# Patient Record
Sex: Male | Born: 1940 | Race: White | Hispanic: No | Smoking: Former smoker
Health system: Southern US, Community
[De-identification: ages and names within clinical notes are randomized; demographics above are authoritative.]

## PROBLEM LIST (undated history)

## (undated) DIAGNOSIS — I82409 Acute embolism and thrombosis of unspecified deep veins of unspecified lower extremity: Secondary | ICD-10-CM

## (undated) DIAGNOSIS — O2230 Deep phlebothrombosis in pregnancy, unspecified trimester: Secondary | ICD-10-CM

## (undated) DIAGNOSIS — M1990 Unspecified osteoarthritis, unspecified site: Secondary | ICD-10-CM

## (undated) DIAGNOSIS — D689 Coagulation defect, unspecified: Secondary | ICD-10-CM

## (undated) DIAGNOSIS — I4510 Unspecified right bundle-branch block: Secondary | ICD-10-CM

## (undated) DIAGNOSIS — D509 Iron deficiency anemia, unspecified: Secondary | ICD-10-CM

## (undated) DIAGNOSIS — I2699 Other pulmonary embolism without acute cor pulmonale: Secondary | ICD-10-CM

## (undated) DIAGNOSIS — H9190 Unspecified hearing loss, unspecified ear: Secondary | ICD-10-CM

## (undated) DIAGNOSIS — R6510 Systemic inflammatory response syndrome (SIRS) of non-infectious origin without acute organ dysfunction: Secondary | ICD-10-CM

## (undated) DIAGNOSIS — T7840XA Allergy, unspecified, initial encounter: Secondary | ICD-10-CM

## (undated) DIAGNOSIS — C439 Malignant melanoma of skin, unspecified: Secondary | ICD-10-CM

## (undated) DIAGNOSIS — I10 Essential (primary) hypertension: Secondary | ICD-10-CM

## (undated) DIAGNOSIS — H269 Unspecified cataract: Secondary | ICD-10-CM

## (undated) DIAGNOSIS — C801 Malignant (primary) neoplasm, unspecified: Secondary | ICD-10-CM

## (undated) DIAGNOSIS — D649 Anemia, unspecified: Secondary | ICD-10-CM

## (undated) DIAGNOSIS — C4491 Basal cell carcinoma of skin, unspecified: Secondary | ICD-10-CM

## (undated) DIAGNOSIS — E669 Obesity, unspecified: Secondary | ICD-10-CM

## (undated) DIAGNOSIS — K219 Gastro-esophageal reflux disease without esophagitis: Secondary | ICD-10-CM

## (undated) DIAGNOSIS — R0902 Hypoxemia: Secondary | ICD-10-CM

## (undated) HISTORY — DX: Systemic inflammatory response syndrome (sirs) of non-infectious origin without acute organ dysfunction: R65.10

## (undated) HISTORY — DX: Coagulation defect, unspecified: D68.9

## (undated) HISTORY — DX: Malignant melanoma of skin, unspecified: C43.9

## (undated) HISTORY — DX: Obesity, unspecified: E66.9

## (undated) HISTORY — DX: Gastro-esophageal reflux disease without esophagitis: K21.9

## (undated) HISTORY — DX: Acute embolism and thrombosis of unspecified deep veins of unspecified lower extremity: I82.409

## (undated) HISTORY — DX: Unspecified hearing loss, unspecified ear: H91.90

## (undated) HISTORY — DX: Unspecified right bundle-branch block: I45.10

## (undated) HISTORY — DX: Hypoxemia: R09.02

## (undated) HISTORY — PX: WISDOM TOOTH EXTRACTION: SHX21

## (undated) HISTORY — DX: Basal cell carcinoma of skin, unspecified: C44.91

## (undated) HISTORY — DX: Anemia, unspecified: D64.9

## (undated) HISTORY — DX: Allergy, unspecified, initial encounter: T78.40XA

## (undated) HISTORY — DX: Malignant (primary) neoplasm, unspecified: C80.1

## (undated) HISTORY — DX: Deep phlebothrombosis in pregnancy, unspecified trimester: O22.30

## (undated) HISTORY — DX: Unspecified osteoarthritis, unspecified site: M19.90

## (undated) HISTORY — DX: Unspecified cataract: H26.9

## (undated) HISTORY — DX: Other pulmonary embolism without acute cor pulmonale: I26.99

## (undated) HISTORY — PX: COLONOSCOPY: SHX174

## (undated) HISTORY — PX: ESOPHAGOGASTRODUODENOSCOPY: SHX1529

## (undated) HISTORY — PX: APPENDECTOMY: SHX54

## (undated) HISTORY — PX: TONSILLECTOMY: SUR1361

## (undated) HISTORY — DX: Iron deficiency anemia, unspecified: D50.9

## (undated) HISTORY — PX: CATARACT EXTRACTION: SUR2

---

## 1997-08-16 ENCOUNTER — Emergency Department (HOSPITAL_COMMUNITY): Admission: EM | Admit: 1997-08-16 | Discharge: 1997-08-16 | Payer: Self-pay | Admitting: Emergency Medicine

## 1997-08-19 ENCOUNTER — Emergency Department (HOSPITAL_COMMUNITY): Admission: EM | Admit: 1997-08-19 | Discharge: 1997-08-19 | Payer: Self-pay | Admitting: Emergency Medicine

## 1997-08-23 ENCOUNTER — Encounter (HOSPITAL_COMMUNITY): Admission: RE | Admit: 1997-08-23 | Discharge: 1997-11-21 | Payer: Self-pay | Admitting: *Deleted

## 1999-08-02 ENCOUNTER — Ambulatory Visit (HOSPITAL_COMMUNITY): Admission: RE | Admit: 1999-08-02 | Discharge: 1999-08-02 | Payer: Self-pay | Admitting: Internal Medicine

## 1999-10-23 ENCOUNTER — Encounter (INDEPENDENT_AMBULATORY_CARE_PROVIDER_SITE_OTHER): Payer: Self-pay | Admitting: Specialist

## 1999-10-23 ENCOUNTER — Other Ambulatory Visit: Admission: RE | Admit: 1999-10-23 | Discharge: 1999-10-23 | Payer: Self-pay | Admitting: Gastroenterology

## 2003-03-06 ENCOUNTER — Ambulatory Visit (HOSPITAL_COMMUNITY): Admission: RE | Admit: 2003-03-06 | Discharge: 2003-03-06 | Payer: Self-pay | Admitting: Internal Medicine

## 2009-07-03 ENCOUNTER — Encounter (INDEPENDENT_AMBULATORY_CARE_PROVIDER_SITE_OTHER): Payer: Self-pay | Admitting: *Deleted

## 2009-07-05 ENCOUNTER — Encounter (INDEPENDENT_AMBULATORY_CARE_PROVIDER_SITE_OTHER): Payer: Self-pay | Admitting: *Deleted

## 2009-07-30 ENCOUNTER — Encounter (INDEPENDENT_AMBULATORY_CARE_PROVIDER_SITE_OTHER): Payer: Self-pay | Admitting: *Deleted

## 2009-08-01 ENCOUNTER — Ambulatory Visit: Payer: Self-pay | Admitting: Gastroenterology

## 2009-08-13 ENCOUNTER — Ambulatory Visit: Payer: Self-pay | Admitting: Gastroenterology

## 2009-08-15 ENCOUNTER — Encounter: Payer: Self-pay | Admitting: Gastroenterology

## 2010-02-07 NOTE — Miscellaneous (Signed)
Summary: previsit/rm  Clinical Lists Changes  Medications: Added new medication of MOVIPREP 100 GM  SOLR (PEG-KCL-NACL-NASULF-NA ASC-C) As per prep instructions. - Signed Rx of MOVIPREP 100 GM  SOLR (PEG-KCL-NACL-NASULF-NA ASC-C) As per prep instructions.;  #1 x 0;  Signed;  Entered by: Sherren Kerns RN;  Authorized by: Louis Meckel MD;  Method used: [facility]  [phone]*, [address] [place], [place]  [address], Ph: [phone] or [phone], Fax: [phone] Allergies: Added new allergy or adverse reaction of * SHELLFISH Observations: Added new observation of ALLERGY REV: Done (08/01/2009 13:29) Added new observation of NKA: F (08/01/2009 13:29)    Prescriptions: MOVIPREP 100 GM  SOLR (PEG-KCL-NACL-NASULF-NA ASC-C) As per prep instructions.  #1 x 0   Entered by:   Sherren Kerns RN   Authorized by:   Louis Meckel MD   Signed by:   Sherren Kerns RN on 08/01/2009   Method used:   [facility]ronically to        [facility]  [phone]* (retail)       [address] [place], [place]  [address]       Ph: [phone] or [phone]       Fax: [phone]   RxID:   [id number]

## 2010-02-07 NOTE — Letter (Signed)
Summary: Patient Notice- Polyp Results  [facility]  [address] [place], [place] [address]   Phone: [phone]  Fax: [phone]        August 15, 2009 MRN: [id number]    GRAY DOERING [address] [place], [place]  [address]    Dear Mr. Wysong,  I am pleased to inform you that the colon polyp(s) removed during your recent colonoscopy was (were) found to be benign (no cancer detected) upon pathologic examination.  I recommend you have a repeat colonoscopy examination in 5_ years to look for recurrent polyps, as having colon polyps increases your risk for having recurrent polyps or even colon cancer in the future.  Should you develop new or worsening symptoms of abdominal pain, bowel habit changes or bleeding from the rectum or bowels, please schedule an evaluation with either your primary care physician or with me.  Additional information/recommendations:  __ No further action with gastroenterology is needed at this time. Please      follow-up with your primary care physician for your other healthcare      needs.  __ Please call [phone] to schedule a return visit to review your      situation.  __ Please keep your follow-up visit as already scheduled.  _x_ Continue treatment plan as outlined the day of your exam.  Please call us if you are having persistent problems or have questions about your condition that have not been fully answered at this time.  Sincerely,  Louis Meckel MD  This letter has been electronically signed by your physician.  Appended Document: Patient Notice- Polyp Results letter mailed

## 2010-02-07 NOTE — Letter (Signed)
Summary: [facility] Instructions  [facility]  [address] [place], [place] [address]   Phone: [phone]  Fax: [phone]       Alan [name]    May 14, 1940    MRN: [id number]        Procedure Day /Date:  Monday 08/13/2009     Arrival Time: 9:30 am      Procedure Time: 10:30 am     Location of Procedure:                    _ x_  [facility] (4th Floor)                        PREPARATION FOR COLONOSCOPY WITH MOVIPREP   Starting 5 days prior to your procedure Wednesday 8/3 do not eat nuts, seeds, popcorn, corn, beans, peas,  salads, or any raw vegetables.  Do not take any fiber supplements (e.g. Metamucil, Citrucel, and Benefiber).  THE DAY BEFORE YOUR PROCEDURE         DATE: Sunday 8/7 1.  Drink clear liquids the entire day-NO SOLID FOOD  2.  Do not drink anything colored red or purple.  Avoid juices with pulp.  No orange juice.  3.  Drink at least 64 oz. (8 glasses) of fluid/clear liquids during the day to prevent dehydration and help the prep work efficiently.  CLEAR LIQUIDS INCLUDE: Water Jello Ice Popsicles Tea (sugar ok, no milk/cream) Powdered fruit flavored drinks Coffee (sugar ok, no milk/cream) Gatorade Juice: apple, white grape, white cranberry  Lemonade Clear bullion, consomm, broth Carbonated beverages (any kind) Strained chicken noodle soup Hard Candy                             4.  In the morning, mix first dose of MoviPrep solution:    Empty 1 Pouch A and 1 Pouch B into the disposable container    Add lukewarm drinking water to the top line of the container. Mix to dissolve    Refrigerate (mixed solution should be used within 24 hrs)  5.  Begin drinking the prep at 5:00 p.m. The MoviPrep container is divided by 4 marks.   Every 15 minutes drink the solution down to the next mark (approximately 8 oz) until the full liter is complete.   6.  Follow completed prep with 16 oz of clear liquid of your choice (Nothing red or  purple).  Continue to drink clear liquids until bedtime.  7.  Before going to bed, mix second dose of MoviPrep solution:    Empty 1 Pouch A and 1 Pouch B into the disposable container    Add lukewarm drinking water to the top line of the container. Mix to dissolve    Refrigerate  THE DAY OF YOUR PROCEDURE      DATE: Monday 8/8  Beginning at 5:30 a.m. (5 hours before procedure):         1. Every 15 minutes, drink the solution down to the next mark (approx 8 oz) until the full liter is complete.  2. Follow completed prep with 16 oz. of clear liquid of your choice.    3. You may drink clear liquids until 8:30 am (2 HOURS BEFORE PROCEDURE).   MEDICATION INSTRUCTIONS  Unless otherwise instructed, you should take regular prescription medications with a small sip of water   as early as possible the morning of your  procedure.    Additional medication instructions: n/a         OTHER INSTRUCTIONS  You will need a responsible adult at least 71 years of age to accompany you and drive you home.   This person must remain in the waiting room during your procedure.  Wear loose fitting clothing that is easily removed.  Leave jewelry and other valuables at home.  However, you may wish to bring a book to read or  an iPod/MP3 player to listen to music as you wait for your procedure to start.  Remove all body piercing jewelry and leave at home.  Total time from sign-in until discharge is approximately 2-3 hours.  You should go home directly after your procedure and rest.  You can resume normal activities the  day after your procedure.  The day of your procedure you should not:   Drive   Make legal decisions   Operate machinery   Drink alcohol   Return to work  You will receive specific instructions about eating, activities and medications before you leave.    The above instructions have been reviewed and explained to me by   Sherren Kerns RN  August 01, 2009 1:54  PM     I fully understand and can verbalize these instructions _____________________________ Date _________

## 2010-02-07 NOTE — Procedures (Signed)
Summary: Colonoscopy  Patient: Alan [name] Note: All result statuses are Final unless otherwise noted.  Tests: (1) Colonoscopy (COL)   COL Colonoscopy           DONE     [facility]     [address]     [place], [place]  [address]           COLONOSCOPY PROCEDURE REPORT           PATIENT:  Alan [name], Alan [name]  MR#:  [id number]     BIRTHDATE:  06-Aug-1940, 69 yrs. old  GENDER:  male           ENDOSCOPIST:  Barbette Hair. Arlyce Dice, MD     Referred by:           PROCEDURE DATE:  08/13/2009     PROCEDURE:  Colon with cold biopsy polypectomy     ASA CLASS:  Class II     INDICATIONS:  1) Routine Risk Screening           MEDICATIONS:   Fentanyl 50 mcg IV, Versed 5 mg IV           DESCRIPTION OF PROCEDURE:   After the risks benefits and     alternatives of the procedure were thoroughly explained, informed     consent was obtained.  Digital rectal exam was performed and     revealed no abnormalities.   The LB CF-[id number] endoscope     was introduced through the anus and advanced to the cecum, which     was identified by both the appendix and ileocecal valve, without     limitations.  The quality of the prep was excellent, using     MoviPrep.  The instrument was then slowly withdrawn as the colon     was fully examined.     <<PROCEDUREIMAGES>>           FINDINGS:  A diminutive polyp was found in the ascending colon. It     was 2 cm in size. The polyp was removed using cold biopsy forceps     (see image6).  Moderate diverticulosis was found in the sigmoid     colon (see image2).  Scattered diverticula were found. descending     colon to transverse colon  This was otherwise a normal examination     of the colon (see image3, image5, image9, image12, image14, and     image16).   Retroflexed views in the rectum revealed no     abnormalities.    The time to cecum =  2.25  minutes. The scope     was then withdrawn (time =  6.75  min) from the patient and the     procedure completed.        COMPLICATIONS:  None           ENDOSCOPIC IMPRESSION:     1) 2 cm diminutive polyp in the ascending colon     2) Moderate diverticulosis in the sigmoid colon     3) Diverticula, scattered     4) Otherwise normal examination     RECOMMENDATIONS:     1) If the polyp(s) removed today are proven to be adenomatous     (pre-cancerous) polyps, you will need a repeat colonoscopy in 5     years. Otherwise you should continue to follow colorectal cancer     screening guidelines for "routine risk" patients with colonoscopy     in 10 years.  REPEAT EXAM:   You will receive a letter from Dr. Arlyce Dice in 1-2     weeks, after reviewing the final pathology, with followup     recommendations.           ______________________________     Barbette Hair Arlyce Dice, MD           CC: Nila Nephew, MD           n.     Rosalie Doctor:   Barbette Hair. Kaplan at 08/13/2009 11:00 AM           [place], [phone]  Note: An exclamation mark (!) indicates a result that was not dispersed into the flowsheet. Document Creation Date: 08/13/2009 11:01 AM _______________________________________________________________________  (1) Order result status: Final Collection or observation date-time: 08/13/2009 10:55 Requested date-time:  Receipt date-time:  Reported date-time:  Referring Physician:   Ordering Physician: Melvia Heaps [phone]) Specimen Source:  Source: Launa Grill Order Number: [id number] Lab site:   Appended Document: Colonoscopy     Procedures Next Due Date:    Colonoscopy: 08/2014

## 2010-02-07 NOTE — Letter (Signed)
Summary: Previsit letter  [facility]  [address] [place], [place] [address]   Phone: [phone]  Fax: [phone]       07/05/2009 MRN: [id number]  Alan [name] [address] [place], [place]  [address]  [place]  Dear Mr. Spiers,  Welcome to the Gastroenterology Division at [facility].    You are scheduled to see a nurse for your pre-procedure visit on 08-01-09 at 1:30p.m. on the 3rd floor at [facility], [address]  We ask that you try to arrive at our office 15 minutes prior to your appointment time to allow for check-in.  Your nurse visit will consist of discussing your medical and surgical history, your immediate family medical history, and your medications.    Please bring a complete list of all your medications or, if you prefer, bring the medication bottles and we will list them.  We will need to be aware of both prescribed and over the counter drugs.  We will need to know exact dosage information as well.  If you are on blood thinners (Coumadin, Plavix, Aggrenox, Ticlid, etc.) please call our office today/prior to your appointment, as we need to consult with your physician about holding your medication.   Please be prepared to read and sign documents such as consent forms, a financial agreement, and acknowledgement forms.  If necessary, and with your consent, a friend or relative is welcome to sit-in on the nurse visit with you.  Please bring your insurance card so that we may make a copy of it.  If your insurance requires a referral to see a specialist, please bring your referral form from your primary care physician.  No co-pay is required for this nurse visit.     If you cannot keep your appointment, please call [phone] to cancel or reschedule prior to your appointment date.  This allows [place] the opportunity to schedule an appointment for another patient in need of care.    Thank you for choosing [facility] for your medical  needs.  We appreciate the opportunity to care for you.  Please visit [place] at our website  to learn more about our practice.                     Sincerely.                                                                                                                   The Gastroenterology Division

## 2010-02-07 NOTE — Letter (Signed)
Summary: Colonoscopy Letter  [facility]  [address] [place], [place] [address]   Phone: [phone]  Fax: [phone]      July 03, 2009 MRN: [id number]   Alan [name] [address] [place], [place]  [address]   Dear [name],   According to your medical record, it is time for you to schedule a Colonoscopy. The American Cancer Society recommends this procedure as a method to detect early colon cancer. Patients with a family history of colon cancer, or a personal history of colon polyps or inflammatory bowel disease are at increased risk.  This letter has been generated based on the recommendations made at the time of your procedure. If you feel that in your particular situation this may no longer apply, please contact our office.  Please call our office at [phone] to schedule this appointment or to update your records at your earliest convenience.  Thank you for cooperating with [place] to provide you with the very best care possible.   Sincerely,  Barbette Hair. Arlyce Dice, M.D.  [facility] Gastroenterology Division [phone]

## 2010-12-17 ENCOUNTER — Other Ambulatory Visit: Payer: Self-pay | Admitting: Internal Medicine

## 2010-12-17 ENCOUNTER — Ambulatory Visit
Admission: RE | Admit: 2010-12-17 | Discharge: 2010-12-17 | Disposition: A | Discharge status: Home / Self Care | Payer: Medicare Other | Source: Ambulatory Visit | Attending: Internal Medicine | Admitting: Internal Medicine

## 2011-01-30 ENCOUNTER — Other Ambulatory Visit: Payer: Self-pay | Admitting: Dermatology

## 2011-03-21 ENCOUNTER — Other Ambulatory Visit: Payer: Self-pay | Admitting: Dermatology

## 2011-09-29 ENCOUNTER — Other Ambulatory Visit: Payer: Self-pay

## 2011-09-29 ENCOUNTER — Encounter (HOSPITAL_COMMUNITY): Payer: Self-pay | Admitting: *Deleted

## 2011-09-29 ENCOUNTER — Emergency Department (HOSPITAL_COMMUNITY)
Admission: EM | Admit: 2011-09-29 | Discharge: 2011-09-29 | Disposition: A | Discharge status: Home / Self Care | Payer: Medicare Other | Attending: Emergency Medicine | Admitting: Emergency Medicine

## 2011-09-29 ENCOUNTER — Emergency Department (HOSPITAL_COMMUNITY): Payer: Medicare Other

## 2011-09-29 DIAGNOSIS — I10 Essential (primary) hypertension: Secondary | ICD-10-CM | POA: Insufficient documentation

## 2011-09-29 DIAGNOSIS — G454 Transient global amnesia: Secondary | ICD-10-CM | POA: Insufficient documentation

## 2011-09-29 HISTORY — DX: Essential (primary) hypertension: I10

## 2011-09-29 LAB — CBC WITH DIFFERENTIAL/PLATELET
Basophils Absolute: 0 10*3/uL (ref 0.0–0.1)
Basophils Relative: 0 % (ref 0–1)
Eosinophils Absolute: 0.1 10*3/uL (ref 0.0–0.7)
Eosinophils Relative: 1 % (ref 0–5)
MCH: 31.4 pg (ref 26.0–34.0)
MCV: 91.1 fL (ref 78.0–100.0)
Neutrophils Relative %: 73 % (ref 43–77)
Platelets: 168 10*3/uL (ref 150–400)
RDW: 12.8 % (ref 11.5–15.5)

## 2011-09-29 LAB — COMPREHENSIVE METABOLIC PANEL
ALT: 16 U/L (ref 0–53)
AST: 20 U/L (ref 0–37)
Albumin: 4 g/dL (ref 3.5–5.2)
Calcium: 10.1 mg/dL (ref 8.4–10.5)
GFR calc Af Amer: >90 mL/min (ref >90)
Sodium: 141 mEq/L (ref 135–145)
Total Protein: 7.1 g/dL (ref 6.0–8.3)

## 2011-09-29 NOTE — ED Notes (Signed)
The pt has had confusion since approx 1200. He had breakfast with his daughter 18am today and he felt fine.Marland Kitchen   He feels weak.  At present the pt is alert moving all extremities speech is clear no facial droop.  No arm drift.

## 2011-09-29 NOTE — ED Notes (Signed)
Patient is alert and oriented.  Reports he had a loss of time today from 1130 to later this afternoon.  Patient denies falling,  Denies having incont.  Patient has no neuro deficits noted.  ERMD has been to bedside and patient to be discharged with dx of transient amnesia

## 2011-09-29 NOTE — ED Notes (Signed)
Pt discharged with daughters. Pt instructed not to drive. Pt verbalizes understanding. Pt alert and oriented x 4. Pt ambulatory at discharge.

## 2011-09-29 NOTE — ED Provider Notes (Signed)
History     CSN: [id number]  Arrival date & time 09/29/11  1758   First MD Initiated Contact with Patient 09/29/11 2129      Chief Complaint  Patient presents with  . Altered Mental Status    (Consider location/radiation/quality/duration/timing/severity/associated sxs/prior treatment) HPI 71 year old male complains of a several hour spell today that is now resolved of transient global amnesia and transient confusion. He states he had breakfast this morning with his daughter at 9:00 and felt fine. He states the last time human looking at the clock was approximately 11:30 or possibly due today when he was sitting in his office. He was doing some paperwork in his office in the next thing he knew his mid afternoon 2 or 3:00 in the afternoon he had no idea what he had done for the prior few hours. He also felt confused for the next few hours until his symptoms gradually resolved when he felt normal again. He now is felt normal for the last few hours. He denies any pain and no headache and no change in speech vision swallowing or understanding. He is no lateralizing or focal weakness numbness or incoordination. He states that he felt spaced out he was still able to walk and talk even when shopping. He does have transient short-term memory loss for today for several hours. He has not recovered feeling lightheaded or vertiginous. He feels totally fine now. This episode was unwitnessed his by the time he came back home from shopping in his daughter was there at the house the patient felt essentially back to normal again. The only difference transiently is that the patient's daughter stated he was taking a little bit longer to think that usually would but that is back to baseline now as well. Past Medical History  Diagnosis Date  . Hypertension     History reviewed. No pertinent past surgical history.  No family history on file.  History  Substance Use Topics  . Smoking status: Never Smoker   .  Smokeless tobacco: Not on file  . Alcohol Use: Yes      Review of Systems 10 Systems reviewed and are negative for acute change except as noted in the HPI. Allergies  Shellfish allergy  Home Medications   Current Outpatient Rx  Name Route Sig Dispense Refill  . ASPIRIN 81 MG PO CHEW Oral Chew 81 mg by mouth daily.    . ATENOLOL 50 MG PO TABS Oral Take 50 mg by mouth daily.    . OCUVITE PO TABS Oral Take 1 tablet by mouth daily.    Marland Kitchen ESOMEPRAZOLE MAGNESIUM 40 MG PO CPDR Oral Take 40 mg by mouth daily before breakfast.    . LOSARTAN POTASSIUM 50 MG PO TABS Oral Take 50 mg by mouth daily.      BP 134/59  Pulse 58  Temp 98 F (36.7 C) (Oral)  Resp 16  SpO2 98%  Physical Exam  Nursing note and vitals reviewed. Constitutional:       Awake, alert, nontoxic appearance with baseline speech for patient.  HENT:  Head: Atraumatic.  Mouth/Throat: No oropharyngeal exudate.  Eyes: EOM are normal. Pupils are equal, round, and reactive to light. Right eye exhibits no discharge. Left eye exhibits no discharge.  Neck: Neck supple.  Cardiovascular: Normal rate and regular rhythm.   No murmur heard. Pulmonary/Chest: Effort normal and breath sounds normal. No stridor. No respiratory distress. He has no wheezes. He has no rales. He exhibits no tenderness.  Abdominal: Soft.  Bowel sounds are normal. He exhibits no mass. There is no tenderness. There is no rebound.  Musculoskeletal: He exhibits no tenderness.       Baseline ROM, moves extremities with no obvious new focal weakness.  Lymphadenopathy:    He has no cervical adenopathy.  Neurological:       Awake, alert, cooperative and aware of situation; motor strength bilaterally; sensation normal to light touch bilaterally; peripheral visual fields full to confrontation; no facial asymmetry; tongue midline; major cranial nerves appear intact; no pronator drift, normal finger to nose bilaterally  Skin: No rash noted.  Psychiatric: He has a  normal mood and affect.    ED Course  Procedures (including critical care time) ECG: Normal sinus rhythm, ventricular rate 72, right bundle branch block, left axis deviation, left anterior fascicular block, no acute ischemic changes noted, no comparison ECG available (obtained 1835 reviewed 2145)  D/w Neuro rec outPt f/u, cannot r/o transient global amnesia vs. seizure since unwitnessed event, but feel low risk for syncope and no anticonvulsant warranted. Labs Reviewed  COMPREHENSIVE METABOLIC PANEL - Abnormal; Notable for the following:    Glucose, Bld 105 (*)     GFR calc non Af Amer 85 (*)     All other components within normal limits  CBC WITH DIFFERENTIAL  TROPONIN I  LAB REPORT - SCANNED   Ct Head Wo Contrast  09/29/2011  *RADIOLOGY REPORT*  Clinical Data: Confusion  CT HEAD WITHOUT CONTRAST  Technique:  Contiguous axial images were obtained from the base of the skull through the vertex without contrast.  Comparison: None.  Findings: No acute intracranial hemorrhage.  No focal mass lesion. No CT evidence of acute infarction.   No midline shift or mass effect.  No hydrocephalus.  Basilar cisterns are patent.  The generalized cortical atrophy and mild periventricular white matter hypodensities.  Paranasal sinuses and mastoid air cells are clear.  Orbits are normal.  IMPRESSION:  1.  No acute intracranial findings. 2.  Atrophy and mild microvascular disease.   Original Report Authenticated By: Genevive Bi, M.D.      1. Transient global amnesia       MDM  Patient / Family / Caregiver understand and agree with initial ED impression and plan with expectations set for ED visit. Pt stable in ED with no significant deterioration in condition. Patient / Family / Caregiver informed of clinical course, understand medical decision-making process, and agree with plan. I doubt any other EMC precluding discharge at this time including, but not necessarily limited to the following:Vtach, SAH,  CVA, TIA.        Hurman Horn, MD 09/30/11 2252

## 2013-01-10 ENCOUNTER — Other Ambulatory Visit: Payer: Self-pay | Admitting: Dermatology

## 2013-02-17 ENCOUNTER — Other Ambulatory Visit: Payer: Self-pay | Admitting: Dermatology

## 2013-06-15 ENCOUNTER — Other Ambulatory Visit: Payer: Self-pay | Admitting: Dermatology

## 2013-11-07 ENCOUNTER — Ambulatory Visit (INDEPENDENT_AMBULATORY_CARE_PROVIDER_SITE_OTHER): Payer: Medicare Other | Admitting: Family Medicine

## 2013-11-07 VITALS — BP 144/72 | HR 56 | Temp 97.3°F | Resp 16 | Ht 66.5 in | Wt 297.0 lb

## 2013-11-07 DIAGNOSIS — J40 Bronchitis, not specified as acute or chronic: Secondary | ICD-10-CM

## 2013-11-07 DIAGNOSIS — J069 Acute upper respiratory infection, unspecified: Secondary | ICD-10-CM

## 2013-11-07 MED ORDER — AZITHROMYCIN 250 MG PO TABS: ORAL_TABLET | ORAL | Status: DC

## 2013-11-07 MED ORDER — GUAIFENESIN ER 1200 MG PO TB12
1.0000 | ORAL_TABLET | Freq: Two times a day (BID) | ORAL | Status: DC | PRN
Start: 2013-11-07 — End: 2014-09-07

## 2013-11-07 MED ORDER — HYDROCOD POLST-CHLORPHEN POLST 10-8 MG/5ML PO LQCR: 5.0000 mL | Freq: Two times a day (BID) | ORAL | Status: DC | PRN

## 2013-11-07 NOTE — Progress Notes (Signed)
Subjective:  This chart was scribed for Delman Cheadle, MD by Donato Schultz, Medical Scribe. This patient was seen in Room 1 and the patient's care was started at 2:28 PM.   Patient ID: Alan [name], male    DOB: Nov 06, 1940, 72 y.o.   MRN: [id number]  There are no active problems to display for this patient.  HPI HPI Comments: Alan [name] is a 73 y.o. male who presents to the [facility] complaining of a constant cough productive of clear sputum that started 10 days ago.  He states that his symptoms started with nasal congestion.  At night his cough is productive of dark, clumpy sputum.  He has not used any OTC medications for his symptoms.  He lists postnasal drip, intermittent nausea, dull, achy, full sinus pressure, decreased appetite, muffled hearing and sore throat as associated symptoms.  He denies fever, vomiting, abdominal pain, SOB, chest pain, wheezing, dental pain, and chills as associated symptoms.  He does not have any allergies to antibiotics.    Past Medical History  Diagnosis Date  . Hypertension   . Allergy   . Arthritis   . Cancer   . Cataract    Past Surgical History  Procedure Laterality Date  . Appendectomy     Family History  Problem Relation Age of Onset  . Diabetes Mother   . Heart disease Mother   . Hypertension Mother   . Stroke Mother   . Diabetes Father   . Heart disease Father   . Diabetes Brother    History   Social History  . Marital Status: Widowed    Spouse Name: N/A    Number of Children: N/A  . Years of Education: N/A   Occupational History  . Not on file.   Social History Main Topics  . Smoking status: Never Smoker   . Smokeless tobacco: Not on file  . Alcohol Use: 0.0 oz/week    0 Not specified per week  . Drug Use: Not on file  . Sexual Activity: Not on file   Other Topics Concern  . Not on file   Social History Narrative   Allergies  Allergen Reactions  . Shellfish Allergy Nausea And Vomiting     Review of Systems  Constitutional: Positive for appetite change. Negative for fever and chills.  HENT: Positive for congestion, postnasal drip, sinus pressure and sore throat. Negative for dental problem and ear pain.   Respiratory: Positive for cough. Negative for shortness of breath and wheezing.   Cardiovascular: Negative for chest pain.  Gastrointestinal: Positive for nausea. Negative for vomiting and abdominal pain.  Psychiatric/Behavioral: Positive for sleep disturbance.    Objective:  BP 144/72 mmHg  Pulse 56  Temp(Src) 97.3 F (36.3 C) (Oral)  Resp 16  Ht 5' 6.5" (1.689 m)  Wt 297 lb (134.718 kg)  BMI 47.22 kg/m2  SpO2 95%   Physical Exam  Constitutional: He is oriented to person, place, and time. He appears well-developed and well-nourished.  HENT:  Head: Normocephalic and atraumatic.  Right Ear: Hearing, tympanic membrane, external ear and ear canal normal.  Left Ear: Hearing, tympanic membrane, external ear and ear canal normal.  Nose: Mucosal edema and rhinorrhea present.  Mouth/Throat: Posterior oropharyngeal erythema present.  Copious white mucosal streaking.  Eyes: EOM are normal.  Neck: Normal range of motion. Neck supple. No thyromegaly present.  Cardiovascular: Normal rate, regular rhythm, S1 normal, S2 normal and normal heart sounds.   No murmur  heard. Pulmonary/Chest: Effort normal and breath sounds normal. No respiratory distress. He has no wheezes. He has no rales.  Musculoskeletal: Normal range of motion.  Lymphadenopathy:    He has cervical adenopathy.       Right cervical: Posterior cervical adenopathy present.       Left cervical: No posterior cervical adenopathy present.  Neurological: He is alert and oriented to person, place, and time.  Skin: Skin is warm and dry.  Psychiatric: He has a normal mood and affect. His behavior is normal.  Nursing note and vitals reviewed.   Assessment & Plan:  Will start patient on a course of Azithromycin,  Guaifenesin , and Tussionex.    Acute URI  Bronchitis  Meds ordered this encounter  Medications  . azithromycin (ZITHROMAX) 250 MG tablet    Sig: Take 2 tabs PO x 1 dose, then 1 tab PO QD x 4 days    Dispense:  6 tablet    Refill:  0  . Guaifenesin (MUCINEX MAXIMUM STRENGTH) 1200 MG TB12    Sig: Take 1 tablet (1,200 mg total) by mouth every 12 (twelve) hours as needed.    Dispense:  14 tablet    Refill:  1  . chlorpheniramine-HYDROcodone (TUSSIONEX PENNKINETIC ER) 10-8 MG/5ML LQCR    Sig: Take 5 mLs by mouth every 12 (twelve) hours as needed.    Dispense:  60 mL    Refill:  0    I personally performed the services described in this documentation, which was scribed in my presence. The recorded information has been reviewed and considered, and addended by me as needed.  Delman Cheadle, MD MPH

## 2013-11-07 NOTE — Patient Instructions (Signed)

## 2014-01-23 ENCOUNTER — Other Ambulatory Visit: Payer: Self-pay | Admitting: Dermatology

## 2014-07-28 ENCOUNTER — Encounter: Payer: Self-pay | Admitting: Gastroenterology

## 2014-08-08 ENCOUNTER — Telehealth: Payer: Self-pay | Admitting: Gastroenterology

## 2014-08-08 ENCOUNTER — Encounter: Payer: Self-pay | Admitting: Gastroenterology

## 2014-08-08 NOTE — Telephone Encounter (Addendum)
error 

## 2014-09-07 ENCOUNTER — Ambulatory Visit (AMBULATORY_SURGERY_CENTER): Payer: Self-pay

## 2014-09-07 VITALS — Ht 67.0 in | Wt 297.8 lb

## 2014-09-07 DIAGNOSIS — Z860100 Personal history of colon polyps, unspecified: Secondary | ICD-10-CM

## 2014-09-07 NOTE — Progress Notes (Signed)
No allergies to eggs or soy No home oxygen No diet/weight loss meds No past problems with anesthesia  Refused emmi 

## 2014-09-21 ENCOUNTER — Ambulatory Visit (AMBULATORY_SURGERY_CENTER): Payer: Medicare Other | Admitting: Gastroenterology

## 2014-09-21 ENCOUNTER — Encounter: Payer: Self-pay | Admitting: Gastroenterology

## 2014-09-21 VITALS — BP 125/78 | HR 64 | Temp 97.4°F | Resp 12 | Ht 67.0 in | Wt 297.0 lb

## 2014-09-21 DIAGNOSIS — K5730 Diverticulosis of large intestine without perforation or abscess without bleeding: Secondary | ICD-10-CM

## 2014-09-21 DIAGNOSIS — Z86010 Personal history of colonic polyps: Secondary | ICD-10-CM | POA: Diagnosis not present

## 2014-09-21 MED ORDER — SODIUM CHLORIDE 0.9 % IV SOLN: 500.0000 mL | INTRAVENOUS | Status: DC

## 2014-09-21 NOTE — Progress Notes (Signed)
Report to PACU, RN, vss, BBS= Clear.  

## 2014-09-21 NOTE — Op Note (Signed)
[place]  [address] [place], [id number]   COLONOSCOPY PROCEDURE REPORT  PATIENT: [name], Alan  MR#: [id number] BIRTHDATE: 1940/01/31 , 30  yrs. old GENDER: male ENDOSCOPIST: Inda Castle, MD REFERRED JK:KXFGH Nyoka Cowden, M.D. PROCEDURE DATE:  09/21/2014 PROCEDURE:   Colonoscopy, surveillance First Screening Colonoscopy - Avg.  risk and is 50 yrs.  old or older - No.  Prior Negative Screening - Now for repeat screening. N/A  History of Adenoma - Now for follow-up colonoscopy & has been > or = to 3 yrs.  Yes hx of adenoma.  Has been 3 or more years since last colonoscopy.  Recommend repeat exam, <10 yrs? No ASA CLASS:   Class II INDICATIONS:PH Colon Adenoma. 2011 MEDICATIONS: Monitored anesthesia care, Propofol 140 mg IV, and ephedrin 10mg  IV  DESCRIPTION OF PROCEDURE:   After the risks benefits and alternatives of the procedure were thoroughly explained, informed consent was obtained.  The digital rectal exam revealed no abnormalities of the rectum.   The LB [id number]  endoscope was introduced through the anus and advanced to the cecum, which was identified by both the appendix and ileocecal valve. No adverse events experienced.   The quality of the prep was (Suprep was used) excellent.  The instrument was then slowly withdrawn as the colon was fully examined. Estimated blood loss is zero unless otherwise noted in this procedure report.      COLON FINDINGS: There was moderate diverticulosis noted in the sigmoid colon with associated muscular hypertrophy.   There was mild diverticulosis noted in the ascending colon.  Retroflexed views revealed no abnormalities. The time to cecum = 2.2 Withdrawal time = 6.3   The scope was withdrawn and the procedure completed. COMPLICATIONS: There were no immediate complications.  ENDOSCOPIC IMPRESSION: 1.   There was moderate diverticulosis noted in the sigmoid colon 2.   Mild diverticulosis was noted in the  ascending colon  RECOMMENDATIONS: Given your age, you will not need another colonoscopy for colon cancer screening or polyp surveillance.  These types of tests usually stop around the age 47.  eSigned:  Inda Castle, MD 09/21/2014 3:54 PM   cc:

## 2014-09-21 NOTE — Patient Instructions (Signed)
Discharge instructions given. Handouts on diverticulosis and a high fiber diet given. Resume previous medications. YOU HAD AN ENDOSCOPIC PROCEDURE TODAY AT THE [facility]:   Refer to the procedure report that was given to you for any specific questions about what was found during the examination.  If the procedure report does not answer your questions, please call your gastroenterologist to clarify.  If you requested that your care partner not be given the details of your procedure findings, then the procedure report has been included in a sealed envelope for you to review at your convenience later.  YOU SHOULD EXPECT: Some feelings of bloating in the abdomen. Passage of more gas than usual.  Walking can help get rid of the air that was put into your GI tract during the procedure and reduce the bloating. If you had a lower endoscopy (such as a colonoscopy or flexible sigmoidoscopy) you may notice spotting of blood in your stool or on the toilet paper. If you underwent a bowel prep for your procedure, you may not have a normal bowel movement for a few days.  Please Note:  You might notice some irritation and congestion in your nose or some drainage.  This is from the oxygen used during your procedure.  There is no need for concern and it should clear up in a day or so.  SYMPTOMS TO REPORT IMMEDIATELY:   Following lower endoscopy (colonoscopy or flexible sigmoidoscopy):  Excessive amounts of blood in the stool  Significant tenderness or worsening of abdominal pains  Swelling of the abdomen that is new, acute  Fever of 100F or higher  For urgent or emergent issues, a gastroenterologist can be reached at any hour by calling [phone].   DIET: Your first meal following the procedure should be a small meal and then it is ok to progress to your normal diet. Heavy or fried foods are harder to digest and may make you feel nauseous or bloated.  Likewise, meals heavy in dairy and  vegetables can increase bloating.  Drink plenty of fluids but you should avoid alcoholic beverages for 24 hours.  ACTIVITY:  You should plan to take it easy for the rest of today and you should NOT DRIVE or use heavy machinery until tomorrow (because of the sedation medicines used during the test).    FOLLOW UP: Our staff will call the number listed on your records the next business day following your procedure to check on you and address any questions or concerns that you may have regarding the information given to you following your procedure. If we do not reach you, we will leave a message.  However, if you are feeling well and you are not experiencing any problems, there is no need to return our call.  We will assume that you have returned to your regular daily activities without incident.  If any biopsies were taken you will be contacted by phone or by letter within the next 1-3 weeks.  Please call us at [phone] if you have not heard about the biopsies in 3 weeks.    SIGNATURES/CONFIDENTIALITY: You and/or your care partner have signed paperwork which will be entered into your electronic medical record.  These signatures attest to the fact that that the information above on your After Visit Summary has been reviewed and is understood.  Full responsibility of the confidentiality of this discharge information lies with you and/or your care-partner. 

## 2014-09-21 NOTE — Progress Notes (Signed)
Right before sedation pt ekg changed to very wide QRS complexes that ran for several strips, pt stated he felt ok and BP was normal.  DR K notified and pt taken back to Pre op to run a 12 lead

## 2014-09-22 ENCOUNTER — Telehealth: Payer: Self-pay | Admitting: *Deleted

## 2014-09-22 NOTE — Telephone Encounter (Signed)
Name identifier, left message, follow-up 

## 2015-05-07 ENCOUNTER — Encounter (HOSPITAL_COMMUNITY): Payer: Self-pay | Admitting: *Deleted

## 2015-05-07 ENCOUNTER — Inpatient Hospital Stay (HOSPITAL_COMMUNITY)
Admission: AD | Admit: 2015-05-07 | Discharge: 2015-05-12 | DRG: 202 | Disposition: A | Discharge status: Home Health | Payer: Medicare Other | Source: Ambulatory Visit | Attending: Internal Medicine | Admitting: Internal Medicine

## 2015-05-07 ENCOUNTER — Observation Stay (HOSPITAL_COMMUNITY): Payer: Medicare Other

## 2015-05-07 DIAGNOSIS — I2699 Other pulmonary embolism without acute cor pulmonale: Secondary | ICD-10-CM | POA: Diagnosis present

## 2015-05-07 DIAGNOSIS — Z91013 Allergy to seafood: Secondary | ICD-10-CM

## 2015-05-07 DIAGNOSIS — I272 Other secondary pulmonary hypertension: Secondary | ICD-10-CM | POA: Diagnosis present

## 2015-05-07 DIAGNOSIS — R509 Fever, unspecified: Secondary | ICD-10-CM | POA: Diagnosis present

## 2015-05-07 DIAGNOSIS — R634 Abnormal weight loss: Secondary | ICD-10-CM | POA: Diagnosis present

## 2015-05-07 DIAGNOSIS — J209 Acute bronchitis, unspecified: Principal | ICD-10-CM | POA: Diagnosis present

## 2015-05-07 DIAGNOSIS — H9120 Sudden idiopathic hearing loss, unspecified ear: Secondary | ICD-10-CM | POA: Diagnosis present

## 2015-05-07 DIAGNOSIS — J90 Pleural effusion, not elsewhere classified: Secondary | ICD-10-CM | POA: Diagnosis present

## 2015-05-07 DIAGNOSIS — Z87891 Personal history of nicotine dependence: Secondary | ICD-10-CM

## 2015-05-07 DIAGNOSIS — I959 Hypotension, unspecified: Secondary | ICD-10-CM | POA: Diagnosis present

## 2015-05-07 DIAGNOSIS — J9811 Atelectasis: Secondary | ICD-10-CM | POA: Diagnosis present

## 2015-05-07 DIAGNOSIS — E876 Hypokalemia: Secondary | ICD-10-CM | POA: Diagnosis present

## 2015-05-07 DIAGNOSIS — E860 Dehydration: Secondary | ICD-10-CM | POA: Diagnosis present

## 2015-05-07 DIAGNOSIS — K5900 Constipation, unspecified: Secondary | ICD-10-CM | POA: Diagnosis present

## 2015-05-07 DIAGNOSIS — N200 Calculus of kidney: Secondary | ICD-10-CM | POA: Diagnosis present

## 2015-05-07 DIAGNOSIS — I10 Essential (primary) hypertension: Secondary | ICD-10-CM | POA: Diagnosis present

## 2015-05-07 DIAGNOSIS — J189 Pneumonia, unspecified organism: Secondary | ICD-10-CM | POA: Diagnosis present

## 2015-05-07 DIAGNOSIS — H9190 Unspecified hearing loss, unspecified ear: Secondary | ICD-10-CM

## 2015-05-07 DIAGNOSIS — I4510 Unspecified right bundle-branch block: Secondary | ICD-10-CM | POA: Diagnosis present

## 2015-05-07 DIAGNOSIS — Z79899 Other long term (current) drug therapy: Secondary | ICD-10-CM

## 2015-05-07 DIAGNOSIS — Z85820 Personal history of malignant melanoma of skin: Secondary | ICD-10-CM

## 2015-05-07 DIAGNOSIS — N179 Acute kidney failure, unspecified: Secondary | ICD-10-CM | POA: Diagnosis present

## 2015-05-07 DIAGNOSIS — Z6841 Body Mass Index (BMI) 40.0 and over, adult: Secondary | ICD-10-CM

## 2015-05-07 DIAGNOSIS — R6510 Systemic inflammatory response syndrome (SIRS) of non-infectious origin without acute organ dysfunction: Secondary | ICD-10-CM | POA: Diagnosis present

## 2015-05-07 DIAGNOSIS — T502X5A Adverse effect of carbonic-anhydrase inhibitors, benzothiadiazides and other diuretics, initial encounter: Secondary | ICD-10-CM | POA: Diagnosis present

## 2015-05-07 DIAGNOSIS — R17 Unspecified jaundice: Secondary | ICD-10-CM | POA: Diagnosis present

## 2015-05-07 DIAGNOSIS — I82401 Acute embolism and thrombosis of unspecified deep veins of right lower extremity: Secondary | ICD-10-CM | POA: Diagnosis present

## 2015-05-07 DIAGNOSIS — Z7982 Long term (current) use of aspirin: Secondary | ICD-10-CM

## 2015-05-07 LAB — URINALYSIS, ROUTINE W REFLEX MICROSCOPIC
Glucose, UA: NEGATIVE mg/dL
Hgb urine dipstick: NEGATIVE
Ketones, ur: 15 mg/dL — AB
NITRITE: NEGATIVE
PH: 5.5 (ref 5.0–8.0)
Protein, ur: 30 mg/dL — AB
SPECIFIC GRAVITY, URINE: 1.024 (ref 1.005–1.030)

## 2015-05-07 LAB — COMPREHENSIVE METABOLIC PANEL
ALT: 26 U/L (ref 17–63)
ANION GAP: 11 (ref 5–15)
AST: 25 U/L (ref 15–41)
Albumin: 3.2 g/dL — ABNORMAL LOW (ref 3.5–5.0)
Alkaline Phosphatase: 49 U/L (ref 38–126)
BUN: 35 mg/dL — ABNORMAL HIGH (ref 6–20)
CALCIUM: 9.3 mg/dL (ref 8.9–10.3)
CHLORIDE: 100 mmol/L — AB (ref 101–111)
CO2: 27 mmol/L (ref 22–32)
Creatinine, Ser: 1.38 mg/dL — ABNORMAL HIGH (ref 0.61–1.24)
GFR calc non Af Amer: 48 mL/min — ABNORMAL LOW (ref >60)
GFR, EST AFRICAN AMERICAN: 56 mL/min — AB (ref >60)
Glucose, Bld: 128 mg/dL — ABNORMAL HIGH (ref 65–99)
Potassium: 2.8 mmol/L — ABNORMAL LOW (ref 3.5–5.1)
SODIUM: 138 mmol/L (ref 135–145)
Total Bilirubin: 2 mg/dL — ABNORMAL HIGH (ref 0.3–1.2)
Total Protein: 6 g/dL — ABNORMAL LOW (ref 6.5–8.1)

## 2015-05-07 LAB — CBC WITH DIFFERENTIAL/PLATELET
Basophils Absolute: 0 10*3/uL (ref 0.0–0.1)
Basophils Relative: 0 %
Eosinophils Absolute: 0 10*3/uL (ref 0.0–0.7)
Eosinophils Relative: 0 %
HEMATOCRIT: 38.8 % — AB (ref 39.0–52.0)
HEMOGLOBIN: 13.3 g/dL (ref 13.0–17.0)
LYMPHS ABS: 1.2 10*3/uL (ref 0.7–4.0)
Lymphocytes Relative: 17 %
MCH: 31 pg (ref 26.0–34.0)
MCHC: 34.3 g/dL (ref 30.0–36.0)
MCV: 90.4 fL (ref 78.0–100.0)
MONOS PCT: 14 %
Monocytes Absolute: 1 10*3/uL (ref 0.1–1.0)
NEUTROS ABS: 4.9 10*3/uL (ref 1.7–7.7)
NEUTROS PCT: 69 %
Platelets: 164 10*3/uL (ref 150–400)
RBC: 4.29 MIL/uL (ref 4.22–5.81)
RDW: 13.6 % (ref 11.5–15.5)
WBC: 7.1 10*3/uL (ref 4.0–10.5)

## 2015-05-07 LAB — URINE MICROSCOPIC-ADD ON
BACTERIA UA: NONE SEEN
RBC / HPF: NONE SEEN RBC/hpf (ref 0–5)

## 2015-05-07 MED ORDER — ENOXAPARIN SODIUM 30 MG/0.3ML ~~LOC~~ SOLN
30.0000 mg | SUBCUTANEOUS | Status: DC
  Administered 2015-05-08: 30 mg via SUBCUTANEOUS
  Filled 2015-05-07: qty 0.3

## 2015-05-07 MED ORDER — SODIUM CHLORIDE 0.9 % IV SOLN
INTRAVENOUS | Status: DC
  Administered 2015-05-07: 23:00:00 via INTRAVENOUS

## 2015-05-07 MED ORDER — SODIUM CHLORIDE 0.9% FLUSH
3.0000 mL | Freq: Two times a day (BID) | INTRAVENOUS | Status: DC
  Administered 2015-05-07 – 2015-05-11 (×3): 3 mL via INTRAVENOUS

## 2015-05-07 MED ORDER — ONDANSETRON HCL 4 MG/2ML IJ SOLN
4.0000 mg | Freq: Four times a day (QID) | INTRAMUSCULAR | Status: DC | PRN
  Administered 2015-05-08 – 2015-05-11 (×6): 4 mg via INTRAVENOUS
  Filled 2015-05-07 (×6): qty 2

## 2015-05-07 MED ORDER — ACETAMINOPHEN 650 MG RE SUPP: 650.0000 mg | Freq: Four times a day (QID) | RECTAL | Status: DC | PRN

## 2015-05-07 MED ORDER — ONDANSETRON HCL 4 MG PO TABS: 4.0000 mg | ORAL_TABLET | Freq: Four times a day (QID) | ORAL | Status: DC | PRN

## 2015-05-07 MED ORDER — HYDROCODONE-ACETAMINOPHEN 5-325 MG PO TABS
1.0000 | ORAL_TABLET | ORAL | Status: DC | PRN
  Administered 2015-05-10 – 2015-05-11 (×3): 2 via ORAL
  Filled 2015-05-07 (×3): qty 2

## 2015-05-07 MED ORDER — ACETAMINOPHEN 325 MG PO TABS
650.0000 mg | ORAL_TABLET | Freq: Four times a day (QID) | ORAL | Status: DC | PRN
  Administered 2015-05-08 – 2015-05-10 (×5): 650 mg via ORAL
  Filled 2015-05-07 (×5): qty 2

## 2015-05-07 MED ORDER — SODIUM CHLORIDE 0.9 % IV BOLUS (SEPSIS)
500.0000 mL | Freq: Once | INTRAVENOUS | Status: AC
  Administered 2015-05-07: 500 mL via INTRAVENOUS

## 2015-05-07 MED ORDER — PANTOPRAZOLE SODIUM 40 MG PO TBEC
40.0000 mg | DELAYED_RELEASE_TABLET | Freq: Every day | ORAL | Status: DC
  Administered 2015-05-07 – 2015-05-12 (×6): 40 mg via ORAL
  Filled 2015-05-07 (×6): qty 1

## 2015-05-08 ENCOUNTER — Observation Stay (HOSPITAL_COMMUNITY): Payer: Medicare Other

## 2015-05-08 ENCOUNTER — Encounter (HOSPITAL_COMMUNITY): Payer: Self-pay | Admitting: Internal Medicine

## 2015-05-08 DIAGNOSIS — R6510 Systemic inflammatory response syndrome (SIRS) of non-infectious origin without acute organ dysfunction: Secondary | ICD-10-CM | POA: Diagnosis present

## 2015-05-08 DIAGNOSIS — E860 Dehydration: Secondary | ICD-10-CM | POA: Diagnosis present

## 2015-05-08 DIAGNOSIS — Z7982 Long term (current) use of aspirin: Secondary | ICD-10-CM | POA: Diagnosis not present

## 2015-05-08 DIAGNOSIS — Z6841 Body Mass Index (BMI) 40.0 and over, adult: Secondary | ICD-10-CM | POA: Diagnosis not present

## 2015-05-08 DIAGNOSIS — J209 Acute bronchitis, unspecified: Secondary | ICD-10-CM | POA: Diagnosis present

## 2015-05-08 DIAGNOSIS — I272 Other secondary pulmonary hypertension: Secondary | ICD-10-CM | POA: Diagnosis present

## 2015-05-08 DIAGNOSIS — E876 Hypokalemia: Secondary | ICD-10-CM | POA: Diagnosis present

## 2015-05-08 DIAGNOSIS — I959 Hypotension, unspecified: Secondary | ICD-10-CM | POA: Diagnosis present

## 2015-05-08 DIAGNOSIS — H9190 Unspecified hearing loss, unspecified ear: Secondary | ICD-10-CM

## 2015-05-08 DIAGNOSIS — N200 Calculus of kidney: Secondary | ICD-10-CM | POA: Diagnosis present

## 2015-05-08 DIAGNOSIS — R634 Abnormal weight loss: Secondary | ICD-10-CM | POA: Diagnosis present

## 2015-05-08 DIAGNOSIS — T502X5A Adverse effect of carbonic-anhydrase inhibitors, benzothiadiazides and other diuretics, initial encounter: Secondary | ICD-10-CM | POA: Diagnosis present

## 2015-05-08 DIAGNOSIS — N179 Acute kidney failure, unspecified: Secondary | ICD-10-CM | POA: Diagnosis present

## 2015-05-08 DIAGNOSIS — R17 Unspecified jaundice: Secondary | ICD-10-CM | POA: Diagnosis present

## 2015-05-08 DIAGNOSIS — J90 Pleural effusion, not elsewhere classified: Secondary | ICD-10-CM | POA: Diagnosis present

## 2015-05-08 DIAGNOSIS — J9811 Atelectasis: Secondary | ICD-10-CM | POA: Diagnosis present

## 2015-05-08 DIAGNOSIS — K5900 Constipation, unspecified: Secondary | ICD-10-CM | POA: Insufficient documentation

## 2015-05-08 DIAGNOSIS — I10 Essential (primary) hypertension: Secondary | ICD-10-CM | POA: Diagnosis present

## 2015-05-08 DIAGNOSIS — Z87891 Personal history of nicotine dependence: Secondary | ICD-10-CM | POA: Diagnosis not present

## 2015-05-08 DIAGNOSIS — I2699 Other pulmonary embolism without acute cor pulmonale: Secondary | ICD-10-CM | POA: Diagnosis present

## 2015-05-08 DIAGNOSIS — Z91013 Allergy to seafood: Secondary | ICD-10-CM | POA: Diagnosis not present

## 2015-05-08 DIAGNOSIS — I82401 Acute embolism and thrombosis of unspecified deep veins of right lower extremity: Secondary | ICD-10-CM | POA: Diagnosis present

## 2015-05-08 DIAGNOSIS — H9120 Sudden idiopathic hearing loss, unspecified ear: Secondary | ICD-10-CM | POA: Diagnosis present

## 2015-05-08 DIAGNOSIS — I4510 Unspecified right bundle-branch block: Secondary | ICD-10-CM | POA: Diagnosis present

## 2015-05-08 DIAGNOSIS — I952 Hypotension due to drugs: Secondary | ICD-10-CM

## 2015-05-08 DIAGNOSIS — Z79899 Other long term (current) drug therapy: Secondary | ICD-10-CM | POA: Diagnosis not present

## 2015-05-08 DIAGNOSIS — J189 Pneumonia, unspecified organism: Secondary | ICD-10-CM | POA: Diagnosis present

## 2015-05-08 DIAGNOSIS — Z85820 Personal history of malignant melanoma of skin: Secondary | ICD-10-CM | POA: Diagnosis not present

## 2015-05-08 HISTORY — DX: Systemic inflammatory response syndrome (sirs) of non-infectious origin without acute organ dysfunction: R65.10

## 2015-05-08 HISTORY — DX: Unspecified hearing loss, unspecified ear: H91.90

## 2015-05-08 LAB — CBC
HCT: 39.9 % (ref 39.0–52.0)
HEMOGLOBIN: 13.8 g/dL (ref 13.0–17.0)
MCH: 31.4 pg (ref 26.0–34.0)
MCHC: 34.6 g/dL (ref 30.0–36.0)
MCV: 90.7 fL (ref 78.0–100.0)
Platelets: 160 10*3/uL (ref 150–400)
RBC: 4.4 MIL/uL (ref 4.22–5.81)
RDW: 13.7 % (ref 11.5–15.5)
WBC: 8.1 10*3/uL (ref 4.0–10.5)

## 2015-05-08 LAB — COMPREHENSIVE METABOLIC PANEL
ALT: 24 U/L (ref 17–63)
ANION GAP: 12 (ref 5–15)
AST: 27 U/L (ref 15–41)
Albumin: 3 g/dL — ABNORMAL LOW (ref 3.5–5.0)
Alkaline Phosphatase: 50 U/L (ref 38–126)
BUN: 34 mg/dL — ABNORMAL HIGH (ref 6–20)
CHLORIDE: 102 mmol/L (ref 101–111)
CO2: 25 mmol/L (ref 22–32)
Calcium: 9.3 mg/dL (ref 8.9–10.3)
Creatinine, Ser: 1.27 mg/dL — ABNORMAL HIGH (ref 0.61–1.24)
GFR calc non Af Amer: 53 mL/min — ABNORMAL LOW (ref >60)
Glucose, Bld: 113 mg/dL — ABNORMAL HIGH (ref 65–99)
POTASSIUM: 3.3 mmol/L — AB (ref 3.5–5.1)
SODIUM: 139 mmol/L (ref 135–145)
Total Bilirubin: 2 mg/dL — ABNORMAL HIGH (ref 0.3–1.2)
Total Protein: 5.9 g/dL — ABNORMAL LOW (ref 6.5–8.1)

## 2015-05-08 LAB — CREATININE, URINE, RANDOM: Creatinine, Urine: 165.47 mg/dL

## 2015-05-08 LAB — PHOSPHORUS
Phosphorus: 1.8 mg/dL — ABNORMAL LOW (ref 2.5–4.6)
Phosphorus: 1.8 mg/dL — ABNORMAL LOW (ref 2.5–4.6)

## 2015-05-08 LAB — MAGNESIUM
MAGNESIUM: 1.6 mg/dL — AB (ref 1.7–2.4)
MAGNESIUM: 1.8 mg/dL (ref 1.7–2.4)

## 2015-05-08 LAB — PROTIME-INR
INR: 1.04 (ref 0.00–1.49)
Prothrombin Time: 13.8 seconds (ref 11.6–15.2)

## 2015-05-08 LAB — BRAIN NATRIURETIC PEPTIDE: B Natriuretic Peptide: 33.2 pg/mL (ref 0.0–100.0)

## 2015-05-08 LAB — TSH: TSH: 1.514 u[IU]/mL (ref 0.350–4.500)

## 2015-05-08 LAB — SODIUM, URINE, RANDOM: SODIUM UR: 53 mmol/L

## 2015-05-08 LAB — CORTISOL-AM, BLOOD: Cortisol - AM: 19 ug/dL (ref 6.7–22.6)

## 2015-05-08 MED ORDER — POTASSIUM CHLORIDE 10 MEQ/100ML IV SOLN
10.0000 meq | INTRAVENOUS | Status: AC
  Administered 2015-05-08 (×4): 10 meq via INTRAVENOUS
  Filled 2015-05-08 (×4): qty 100

## 2015-05-08 MED ORDER — POTASSIUM PHOSPHATES 15 MMOLE/5ML IV SOLN: 20.0000 mmol | Freq: Once | INTRAVENOUS | Status: DC

## 2015-05-08 MED ORDER — DOXYCYCLINE HYCLATE 100 MG PO TABS
100.0000 mg | ORAL_TABLET | Freq: Two times a day (BID) | ORAL | Status: DC
  Administered 2015-05-08 – 2015-05-12 (×10): 100 mg via ORAL
  Filled 2015-05-08 (×10): qty 1

## 2015-05-08 MED ORDER — ENOXAPARIN SODIUM 40 MG/0.4ML ~~LOC~~ SOLN: 40.0000 mg | SUBCUTANEOUS | Status: DC

## 2015-05-08 MED ORDER — ALBUTEROL SULFATE (2.5 MG/3ML) 0.083% IN NEBU: 2.5000 mg | INHALATION_SOLUTION | RESPIRATORY_TRACT | Status: DC | PRN

## 2015-05-08 MED ORDER — POTASSIUM & SODIUM PHOSPHATES 280-160-250 MG PO PACK
1.0000 | PACK | Freq: Three times a day (TID) | ORAL | Status: AC
  Administered 2015-05-08 (×3): 1 via ORAL
  Filled 2015-05-08 (×3): qty 1

## 2015-05-08 MED ORDER — PRO-STAT SUGAR FREE PO LIQD
30.0000 mL | Freq: Two times a day (BID) | ORAL | Status: DC
  Administered 2015-05-08: 30 mL via ORAL
  Filled 2015-05-08 (×3): qty 30

## 2015-05-08 MED ORDER — SENNA 8.6 MG PO TABS
1.0000 | ORAL_TABLET | Freq: Every day | ORAL | Status: DC
  Administered 2015-05-08 – 2015-05-11 (×4): 8.6 mg via ORAL
  Filled 2015-05-08 (×4): qty 1

## 2015-05-08 MED ORDER — GUAIFENESIN ER 600 MG PO TB12
600.0000 mg | ORAL_TABLET | Freq: Two times a day (BID) | ORAL | Status: DC | PRN
  Administered 2015-05-08: 600 mg via ORAL
  Filled 2015-05-08: qty 1

## 2015-05-08 MED ORDER — ASPIRIN 81 MG PO CHEW
81.0000 mg | CHEWABLE_TABLET | Freq: Every day | ORAL | Status: DC
Start: 2015-05-08 — End: 2015-05-11
  Administered 2015-05-08 – 2015-05-11 (×4): 81 mg via ORAL
  Filled 2015-05-08 (×4): qty 1

## 2015-05-08 MED ORDER — SODIUM CHLORIDE 0.9 % IV SOLN
INTRAVENOUS | Status: DC
  Administered 2015-05-08: 75 mL/h via INTRAVENOUS
  Administered 2015-05-09 – 2015-05-12 (×8): via INTRAVENOUS

## 2015-05-08 MED ORDER — BISACODYL 10 MG RE SUPP: 10.0000 mg | Freq: Every day | RECTAL | Status: DC | PRN

## 2015-05-08 MED ORDER — POLYETHYLENE GLYCOL 3350 17 G PO PACK
17.0000 g | PACK | Freq: Two times a day (BID) | ORAL | Status: DC
  Administered 2015-05-08 – 2015-05-11 (×6): 17 g via ORAL
  Filled 2015-05-08 (×9): qty 1

## 2015-05-08 MED ORDER — ENOXAPARIN SODIUM 60 MG/0.6ML ~~LOC~~ SOLN
0.5000 mg/kg | SUBCUTANEOUS | Status: DC
  Administered 2015-05-09: 60 mg via SUBCUTANEOUS
  Filled 2015-05-08: qty 0.6

## 2015-05-08 NOTE — Progress Notes (Signed)
PROGRESS NOTE  RAMZEY FEDAK [id number] DOB: April 06, 1940 DOA: 05/07/2015 PCP: Criselda Peaches, MD Outpatient Specialists: ENT [facility] Holster  Patient coming from: home direct admit  Brief Narrative: 75 year old male with uncomplicated past medical history, direct admission for relative hypotension.  Assessment/Plan: 1. Relative hypotension secondary to dehydration, over treatment with antihypertensives 2. Acute bronchitis 3. Hypokalemia secondary to hydrochlorothiazide. 4. Hypophosphatemia secondary to hydrochlorothiazide. 5. Dehydration. Secondary to hydrochlorothiazide. Improving with fluids. 6. Isolated hyperbilirubinemia. Suspect secondary to dehydration. No abdominal pain. 7. 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 Constipation   Overall somewhat improved. Plan to continue bronchodilators and antibiotics for bronchitis.  He reports weight loss and improvement in blood pressure. For now continue to hold all antihypertensives.  Replete phosphorus.  Repeat CMP in the morning. Significance of isolated hyperbilirubinemia of unclear.  Aggressive bowel regimen.  DVT prophylaxis: enoxaparin Code Status: full code Family Communication: daughter Danae Chen at bedsdie Disposition Plan: home  Murray Hodgkins, MD  [facility] Direct contact:  --[place]ia [place]  --[email or web address]; password [email or web address] and click  [email or web address] contact night coverage as above 05/08/2015, 2:59 PM  LOS: 1 day   Consultants:    Procedures:    Antimicrobials:  Doxycycline 5/2 >>  HPI/Subjective: Feels better. Was able to eat some breakfast this morning although he continues to have nausea. No vomiting. Breathing okay. Continues to have significant cough. He is felt poorly over the last month which each repeated steroids he was taking for sudden hearing loss. Also has symptoms suggestive of orthostatic hypotension.  He's had weight loss and poor appetite.  Objective: Filed Vitals:   05/07/15 2111 05/08/15 0218 05/08/15 0521 05/08/15 1308  BP: 115/66 120/51 115/60 122/52  Pulse: 72 74 78 84  Temp: 97.9 F (36.6 C) 99.2 F (37.3 C) 98.3 F (36.8 C) 98.5 F (36.9 C)  TempSrc: Oral Oral Oral Oral  Resp: 20 20 20 18   Height:      Weight:      SpO2: 95% 91% 94% 94%    Intake/Output Summary (Last 24 hours) at 05/08/15 1459 Last data filed at 05/08/15 0700  Gross per 24 hour  Intake 1126.67 ml  Output    450 ml  Net 676.67 ml     Filed Weights   05/07/15 1839  Weight: 121.836 kg (268 lb 9.6 oz)    Exam:    Constitutional:  . Appears calm and comfortable Respiratory:  . Bilateral wheezes. No rhonchi or rales. Frequent cough. He will speak in full sentences. Marland Kitchen Respiratory effort normal.  Cardiovascular:  . RRR, no m/r/g . No LE extremity edema   . Telemetry SR Psychiatric:  . Mental status o Mood, affect appropriate  I have personally reviewed following labs and imaging studies:  Potassium up to 3.3  Creatinine improved, 1.27  Phosphorus 1.8  Total bilirubin 2.0  CBC unremarkable  Chest x-ray suggestive bronchitis  Scheduled Meds: . aspirin  81 mg Oral Daily  . doxycycline  100 mg Oral Q12H  . [START ON 05/09/2015] enoxaparin (LOVENOX) injection  0.5 mg/kg Subcutaneous Q24H  . feeding supplement (PRO-STAT SUGAR FREE 64)  30 mL Oral BID  . pantoprazole  40 mg Oral Daily  . polyethylene glycol  17 g Oral BID  . potassium & sodium phosphates  1 packet Oral TID WC & HS  . senna  1 tablet Oral QHS  . sodium chloride flush  3 mL Intravenous  Q12H   Continuous Infusions: . sodium chloride 75 mL/hr (05/08/15 1255)    Principal Problem:   Acute bronchitis Active Problems:   Hypotension   Hypokalemia   Dehydration   Hearing loss   Essential hypertension   Right bundle branch block (RBBB)   Hypophosphatemia   LOS: 1 day   Time spent 20 minutes      '

## 2015-05-08 NOTE — Progress Notes (Signed)
Initial Nutrition Assessment  DOCUMENTATION CODES:   Morbid obesity  INTERVENTION:  - Will order 30 mL Prostat BID, each supplement provides 100 kcal and 15 grams of protein. - Continue to encourage protein intakes at meals and intake of Prostat - RD will continue to monitor for additional nutrition-related needs  NUTRITION DIAGNOSIS:   Inadequate oral intake related to acute illness, nausea as evidenced by per patient/family report.  GOAL:   Patient will meet greater than or equal to 90% of their needs  MONITOR:   PO intake, Supplement acceptance, Weight trends, Labs, I & O's  REASON FOR ASSESSMENT:   Malnutrition Screening Tool  ASSESSMENT:   75 y.o. male with medical history significant of hearing loss, hypertension. Presented with episode of hypotension in the office. He has has some episodes of malaise and occasional lightheaded spells. HE has not been checking blood pressure at home. He has felt weak all over. He have had some cough for the past 24 hours. He was not even aware he had a fever. Patient was seen at PCP office was noted to be febrile and hypotensive 106/51. At baseline his BP 145/70 but he have not checked it for a while. He denies any chest pain, no dyspnea, he has had poor appetite. He has had some nausea.  Pt seen for MST. BMI indicates morbid obesity. No intakes documented since admission. Pt reports that for breakfast he had fruit cup, 1/2 bagel, and apple juice. He has not had lunch yet but plans to order chicken salad sandwich and a side item shortly. Pt states that for the past 2-3 weeks he has been experiencing nausea and constipation and has had decreased appetite and intakes associated with these symptoms. Pt states that appetite is slowly returning with rehydration; he states he was informed that he was severely dehydrated on admission.  Pt reports that he was recently on Prednisone per order from ENT MD. He also states that current HTN-related  medications were prescribed when he was "larger" and he believes that current dosages are too high for current needs. Pt reports that over the past 1.5 years he has intentionally lost 30 lbs and that in the past 3 months he has unintentionally lost an additional 20 lbs (7% body weight) which is not significant for time frame. No muscle or fat wasting noted at this time. Will continue to monitor weight trends when dehydration is resolved.   Pt currently not meeting needs. Will order Prostat BID to assist in meeting estimated protein needs. Medications reviewed. IVF: NS @ 75 mL/hr. Labs reviewed; BUN/creatinine elevated but trending down, GFR: 53, K: 3.3 mmol/L and Phos: 1.8 mg/dL; likely not resultant of refeeding.   Diet Order:  Diet Heart Room service appropriate?: Yes; Fluid consistency:: Thin  Skin:  Reviewed, no issues  Last BM:  4/30  Height:   Ht Readings from Last 1 Encounters:  05/07/15 5\' 7"  (1.702 m)    Weight:   Wt Readings from Last 1 Encounters:  05/07/15 268 lb 9.6 oz (121.836 kg)    Ideal Body Weight:  67.27 kg (kg)  BMI:  Body mass index is 42.06 kg/(m^2).  Estimated Nutritional Needs:   Kcal:  1500-1700  Protein:  90-100 grams  Fluid:  >/= 1.7 L/day  EDUCATION NEEDS:   No education needs identified at this time     Jarome Matin, RD, LDN Inpatient Clinical Dietitian Pager # [phone] After hours/weekend pager # [phone]

## 2015-05-08 NOTE — H&P (Signed)
Alan [name] [id number] DOB: 09-Jul-1940 DOA: 05/07/2015   Referring MD Dr. Nyoka Cowden PCP: Criselda Peaches, MD   Outpatient Specialists: ENT Alan [name]  Patient coming from: home direct admit  Chief Complaint: Hypotensive in the office  HPI: Alan [name] is a 75 y.o. male with medical history significant of hearing loss, hypertension    Presented with episode of hypotension in the office. He has has some episodes of malaise and occasional lightheaded spells. HE has not been checking blood pressure at home. He has felt weak all over.  He have had some cough for the past 24 hours. He was not even aware he had a fever. Patient was seen at PCP office was noted to be febrile and hypotensive 106/51. At baseline his BP 145/70 but he have not checked it for a while.  He denies any chest pain, no dyspnea, he have had poor appetite. He have had some nausea.   Regarding pertinent Chronic problems: Patient has history of sudden idiopathic hearing loss for which she has been seen by Dr. Constance [name] with [facility] she was given steroid taper with no improvement. The plan was for patient to's taper off his steroids. He reports last colonoscopy was normal last year.   Hospitalist was called for admission for hypotension in the office and fevers  Review of Systems:    Pertinent positives include: Fevers, chills, fatigue, weight loss   Constitutional:  No weight loss, night sweats,   HEENT:  No headaches, Difficulty swallowing,Tooth/dental problems,Sore throat,  No sneezing, itching, ear ache, nasal congestion, post nasal drip,  Cardio-vascular:  No chest pain, Orthopnea, PND, anasarca, dizziness, palpitations.no Bilateral lower extremity swelling  GI:  No heartburn, indigestion, abdominal pain, nausea, vomiting, diarrhea, change in bowel habits, loss of appetite, melena, blood in stool, hematemesis Resp:  no shortness of breath at rest. No dyspnea on exertion, No excess mucus, no productive cough, No  non-productive cough, No coughing up of blood.No change in color of mucus.No wheezing. Skin:  no rash or lesions. No jaundice GU:  no dysuria, change in color of urine, no urgency or frequency. No straining to urinate.  No flank pain.  Musculoskeletal:  No joint pain or no joint swelling. No decreased range of motion. No back pain.  Psych:  No change in mood or affect. No depression or anxiety. No memory loss.  Neuro: no localizing neurological complaints, no tingling, no weakness, no double vision, no gait abnormality, no slurred speech, no confusion  As per HPI otherwise 10 point review of systems negative.   Past Medical History: Past Medical History  Diagnosis Date  . Hypertension   . Allergy   . Arthritis   . Cancer ([place])   . Cataract    Past Surgical History  Procedure Laterality Date  . Appendectomy       Social History:  Ambulatory  independently  Lives at home  With family    reports that he has quit smoking. He has never used smokeless tobacco. He reports that he drinks alcohol. He reports that he does not use illicit drugs.  Allergies:   Allergies  Allergen Reactions  . Shellfish Allergy Nausea And Vomiting       Family History:    Family History  Problem Relation Age of Onset  . Diabetes Mother   . Heart disease Mother   . Hypertension Mother   . Stroke Mother   . Diabetes Father   . Heart disease Father   .  Diabetes Brother     Medications: Prior to Admission medications   Medication Sig Start Date End Date Taking? Authorizing Provider  aspirin 81 MG chewable tablet Chew 81 mg by mouth daily.   Yes Historical Provider, MD  aspirin-acetaminophen-caffeine (EXCEDRIN MIGRAINE) [phone] MG tablet Take 1 tablet by mouth every 6 (six) hours as needed for headache.   Yes Historical Provider, MD  atenolol (TENORMIN) 50 MG tablet Take 50 mg by mouth daily.   Yes Historical Provider, MD  beta carotene w/minerals (OCUVITE) tablet Take 1 tablet by  mouth daily.   Yes Historical Provider, MD  hydrochlorothiazide (HYDRODIURIL) 25 MG tablet Take 25 mg by mouth daily.   Yes Historical Provider, MD  ibuprofen (ADVIL,MOTRIN) 200 MG tablet Take 400 mg by mouth every 6 (six) hours as needed for moderate pain.   Yes Historical Provider, MD  losartan (COZAAR) 100 MG tablet Take 100 mg by mouth daily. 03/11/15  Yes Historical Provider, MD  omeprazole (PRILOSEC) 20 MG capsule Take 20 mg by mouth daily.   Yes Historical Provider, MD    Physical Exam: Patient Vitals for the past 24 hrs:  BP Temp Temp src Pulse Resp SpO2 Height Weight  05/07/15 2111 115/66 mmHg 97.9 F (36.6 C) Oral 72 20 95 % - -  05/07/15 1839 (!) 106/51 mmHg 100.1 F (37.8 C) Oral 78 20 92 % 5\' 7"  (1.702 m) 121.836 kg (268 lb 9.6 oz)    1. General:  in No Acute distress 2. Psychological: Alert and  Oriented 3. Head/ENT:    Dry Mucous Membranes                          Head Non traumatic, neck supple                          Normal  Dentition 4. SKIN:  decreased Skin turgor,  Skin clean Dry and intact no rash 5. Heart: Regular rate and rhythm no Murmur, Rub or gallop 6. Lungs:  , no wheezes or crackles, diminished  7. Abdomen: Soft, non-tender,   Distended, obese 8. Lower extremities: no clubbing, cyanosis, or edema 9. Neurologically Grossly intact, moving all 4 extremities equally 10. MSK: Normal range of motion   body mass index is 42.06 kg/(m^2).  Labs on Admission:   Labs on Admission: I have personally reviewed following labs and imaging studies  CBC:  Recent Labs Lab 05/07/15 2116  WBC 7.1  NEUTROABS 4.9  HGB 13.3  HCT 38.8*  MCV 90.4  PLT 123456   Basic Metabolic Panel:  Recent Labs Lab 05/07/15 2116  NA 138  K 2.8*  CL 100*  CO2 27  GLUCOSE 128*  BUN 35*  CREATININE 1.38*  CALCIUM 9.3   GFR: Estimated Creatinine Clearance: 56.9 mL/min (by C-G formula based on Cr of 1.38). Liver Function Tests:  Recent Labs Lab 05/07/15 2116  AST 25    ALT 26  ALKPHOS 49  BILITOT 2.0*  PROT 6.0*  ALBUMIN 3.2*   No results for input(s): LIPASE, AMYLASE in the last 168 hours. No results for input(s): AMMONIA in the last 168 hours. Coagulation Profile: No results for input(s): INR, PROTIME in the last 168 hours. Cardiac Enzymes: No results for input(s): CKTOTAL, CKMB, CKMBINDEX, TROPONINI in the last 168 hours. BNP (last 3 results) No results for input(s): PROBNP in the last 8760 hours. HbA1C: No results for input(s): HGBA1C in the last  72 hours. CBG: No results for input(s): GLUCAP in the last 168 hours. Lipid Profile: No results for input(s): CHOL, HDL, LDLCALC, TRIG, CHOLHDL, LDLDIRECT in the last 72 hours. Thyroid Function Tests: No results for input(s): TSH, T4TOTAL, FREET4, T3FREE, THYROIDAB in the last 72 hours. Anemia Panel: No results for input(s): VITAMINB12, FOLATE, FERRITIN, TIBC, IRON, RETICCTPCT in the last 72 hours. Urine analysis:    Component Value Date/Time   COLORURINE AMBER* 05/07/2015 2223   APPEARANCEUR CLOUDY* 05/07/2015 2223   LABSPEC 1.024 05/07/2015 2223   PHURINE 5.5 05/07/2015 2223   GLUCOSEU NEGATIVE 05/07/2015 2223   HGBUR NEGATIVE 05/07/2015 2223   BILIRUBINUR SMALL* 05/07/2015 2223   KETONESUR 15* 05/07/2015 2223   PROTEINUR 30* 05/07/2015 2223   NITRITE NEGATIVE 05/07/2015 2223   LEUKOCYTESUR TRACE* 05/07/2015 2223   Sepsis Labs: @LABRCNTIP (procalcitonin:4,lacticidven:4) )No results found for this or any previous visit (from the past 240 hour(s)).    UA   no evidence of UTI, And citrated urine with elevated ketones  No results found for: HGBA1C  Estimated Creatinine Clearance: 56.9 mL/min (by C-G formula based on Cr of 1.38).  BNP (last 3 results) No results for input(s): PROBNP in the last 8760 hours.   ECG REPORT  Independently reviewed Rate: 80  Rhythm: RBBB ST&T Change: No acute ischemic changes   QTC 489  Filed Weights   05/07/15 1839  Weight: 121.836 kg (268 lb 9.6  oz)     Cultures: No results found for: SDES, SPECREQUEST, CULT, REPTSTATUS   Radiological Exams on Admission: X-ray Chest Pa And Lateral  05/07/2015  CLINICAL DATA:  Cough and fever for the past 2 days. History of hypertension. EXAM: CHEST  2 VIEW COMPARISON:  None. FINDINGS: Normal cardiac silhouette and mediastinal contours given slightly kyphotic projection. Atherosclerotic plaque within the thoracic aorta. Mild diffuse slightly nodular thickening of the pulmonary interstitium. No focal airspace opacities. No pleural effusion or pneumothorax. No evidence of edema. No acute osseus abnormalities. Old left seventh rib fracture. IMPRESSION: Findings suggestive of airways disease / bronchitis. No focal airspace opacities to suggest pneumonia. Electronically Signed   By: Sandi Mariscal M.D.   On: 05/07/2015 23:13    Chart has been reviewed    Assessment/Plan  75 y.o. male with medical history significant of hearing loss, hypertension here with SIRS, bronchitis, hypotension likely due to overtreatment with antihypertensives and dehydtration  Present on Admission:  . SIRS (systemic inflammatory response syndrome) ([place])- possibly due to viral bronchitis, will follow vital signs,rehydrate, obtain respiratory virus panel. Treat with doxycycline. Weight loss and generalized malaise - up to date on colonoscopy, will rehydrate . Hypokalemia - replace, check magnesium level, DC HCTZ . Acute bronchitis - doxycycline, albuterol PRN . Dehydration - will hold HCTZ and rehydrate . Essential hypertension - hold home medications, given weight loss likley will need long term adjustment ARF- - likely secondary to dehydration, check FeNA and if not improved with IVF would obtain renal US   Other plan as per orders.  DVT prophylaxis:    Lovenox     Code Status:  FULL CODE as per patient    Family Communication:   Family not at  Bedside    Disposition Plan:    To home once workup is complete and patient is  stable   Consults called: none   Admission status:   inpatient     Level of care     tele          I have spent  a total of 57 min on this admission    Macio Kissoon 05/08/2015, 2:11 AM    [facility]  Pager [phone]   after 2 AM please page floor coverage PA If 7AM-7PM, please contact the day team taking care of the patient  [email or web address]  Password [email or web address]

## 2015-05-08 NOTE — Care Management Obs Status (Signed)
[facility] NOTIFICATION   Patient Details  Name: JAEGER ARNESON MRN: [id number] Date of Birth: 1940/06/25   Medicare Observation Status Notification Given:  Yes    MahabirJuliann Pulse, RN 05/08/2015, 12:59 PM

## 2015-05-08 NOTE — Care Management Note (Signed)
Case Management Note  Patient Details  Name: Alan [name] MRN: [id number] Date of Birth: May 06, 1940  Subjective/Objective:75 y/o m admitted w/hypotension. From home. PT cons-await recc.                    Action/Plan:d/c plan home.   Expected Discharge Date:                  Expected Discharge Plan:  Home/Self Care  In-House Referral:     Discharge planning Services  CM Consult  Post Acute Care Choice:    Choice offered to:     DME Arranged:    DME Agency:     HH Arranged:    HH Agency:     Status of Service:  In process, will continue to follow  Medicare Important Message Given:    Date Medicare IM Given:    Medicare IM give by:    Date Additional Medicare IM Given:    Additional Medicare Important Message give by:     If discussed at [place] of Stay Meetings, dates discussed:    Additional Comments:  Dessa Phi, RN 05/08/2015, 12:59 PM

## 2015-05-09 ENCOUNTER — Inpatient Hospital Stay (HOSPITAL_COMMUNITY): Payer: Medicare Other

## 2015-05-09 DIAGNOSIS — K5900 Constipation, unspecified: Secondary | ICD-10-CM

## 2015-05-09 DIAGNOSIS — E860 Dehydration: Secondary | ICD-10-CM

## 2015-05-09 DIAGNOSIS — R509 Fever, unspecified: Secondary | ICD-10-CM

## 2015-05-09 DIAGNOSIS — I10 Essential (primary) hypertension: Secondary | ICD-10-CM

## 2015-05-09 DIAGNOSIS — J209 Acute bronchitis, unspecified: Principal | ICD-10-CM

## 2015-05-09 DIAGNOSIS — H9190 Unspecified hearing loss, unspecified ear: Secondary | ICD-10-CM

## 2015-05-09 DIAGNOSIS — R6510 Systemic inflammatory response syndrome (SIRS) of non-infectious origin without acute organ dysfunction: Secondary | ICD-10-CM

## 2015-05-09 DIAGNOSIS — I959 Hypotension, unspecified: Secondary | ICD-10-CM

## 2015-05-09 LAB — COMPREHENSIVE METABOLIC PANEL
ALBUMIN: 2.7 g/dL — AB (ref 3.5–5.0)
ALK PHOS: 50 U/L (ref 38–126)
ALT: 25 U/L (ref 17–63)
ANION GAP: 10 (ref 5–15)
AST: 25 U/L (ref 15–41)
BILIRUBIN TOTAL: 1.1 mg/dL (ref 0.3–1.2)
BUN: 24 mg/dL — ABNORMAL HIGH (ref 6–20)
CALCIUM: 8.8 mg/dL — AB (ref 8.9–10.3)
CHLORIDE: 105 mmol/L (ref 101–111)
CO2: 27 mmol/L (ref 22–32)
Creatinine, Ser: 0.93 mg/dL (ref 0.61–1.24)
Glucose, Bld: 127 mg/dL — ABNORMAL HIGH (ref 65–99)
Potassium: 3.2 mmol/L — ABNORMAL LOW (ref 3.5–5.1)
Sodium: 142 mmol/L (ref 135–145)
Total Protein: 5.4 g/dL — ABNORMAL LOW (ref 6.5–8.1)

## 2015-05-09 LAB — URINE CULTURE

## 2015-05-09 LAB — RESPIRATORY VIRUS PANEL
Adenovirus: NEGATIVE
INFLUENZA A: NEGATIVE
INFLUENZA B 1: NEGATIVE
Metapneumovirus: NEGATIVE
Parainfluenza 1: NEGATIVE
Parainfluenza 2: NEGATIVE
Parainfluenza 3: NEGATIVE
RESPIRATORY SYNCYTIAL VIRUS A: NEGATIVE
RESPIRATORY SYNCYTIAL VIRUS B: NEGATIVE
Rhinovirus: NEGATIVE

## 2015-05-09 LAB — HEMOGLOBIN A1C
Hgb A1c MFr Bld: 6.4 % — ABNORMAL HIGH (ref 4.8–5.6)
MEAN PLASMA GLUCOSE: 137 mg/dL

## 2015-05-09 IMAGING — CT CT ABD-PELV W/ CM
2 of 5 series · 15 of 36 positions shown, 18 images · IV contrast (iopamidol)
Comparison: None.

CLINICAL DATA: Hypotension, malaise, lightheadedness, cough, fever,
weight loss.

EXAM:
CT CHEST, ABDOMEN, AND PELVIS WITH CONTRAST
TECHNIQUE: Multidetector CT imaging of the chest, abdomen and pelvis was
performed following the standard protocol during bolus
administration of intravenous contrast.
CONTRAST:  100mL [C7] IOPAMIDOL ([C7]) INJECTION 61%

[Series 2: cap with st · axial · 0.83mm/px · z∈[-653,-113]mm · 12 of 128 slices shown, 15 images]
[im 10/128  mediastinal]
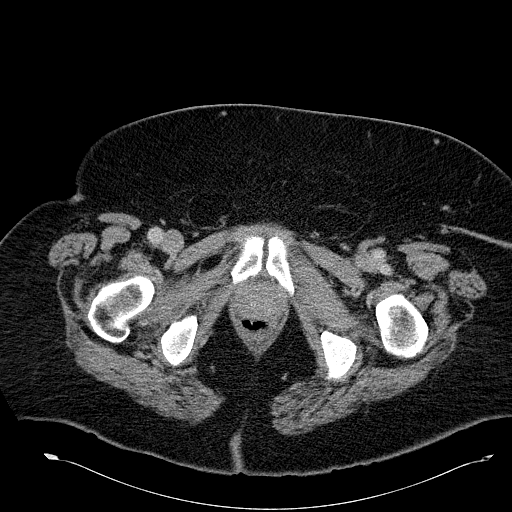
[im 10/128  lung]
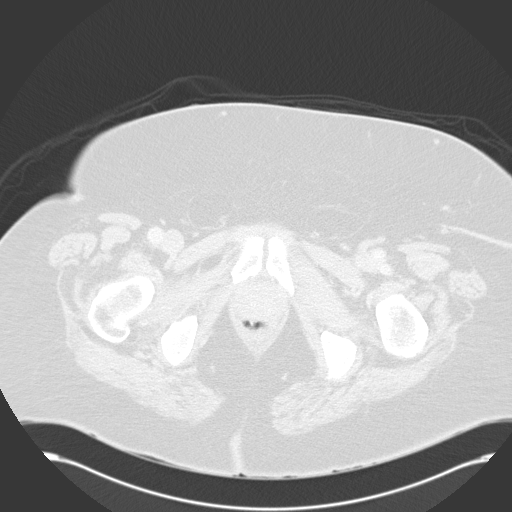
[im 20/128  lung]
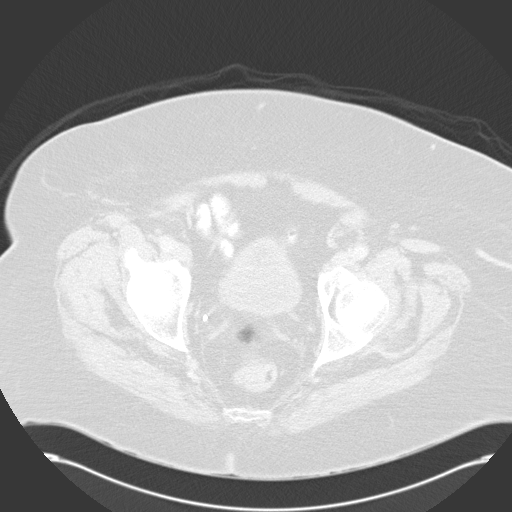
[im 30/128  lung]
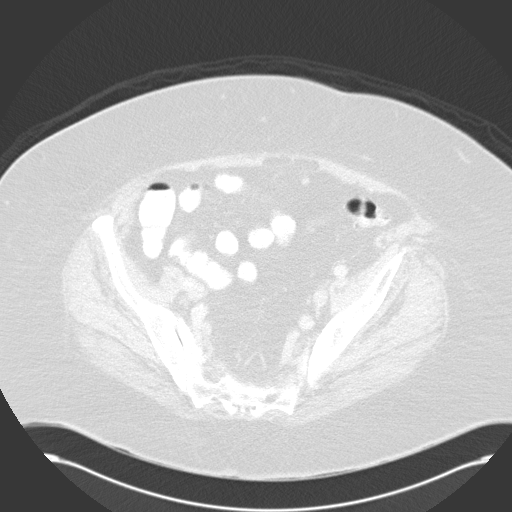
[im 40/128  lung]
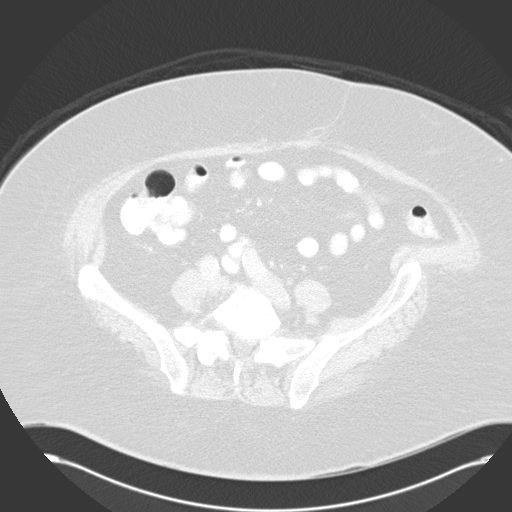
[im 49/128  mediastinal]
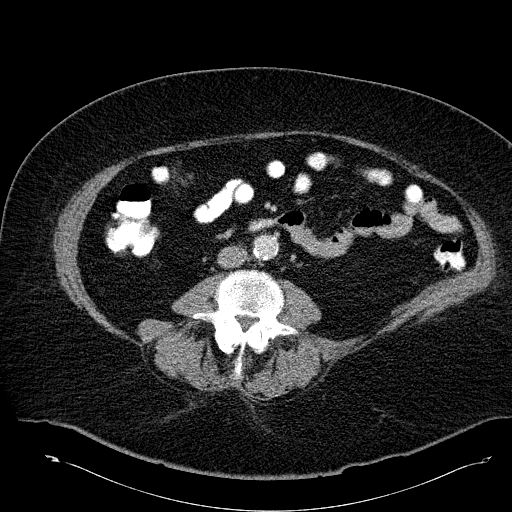
[im 49/128  lung]
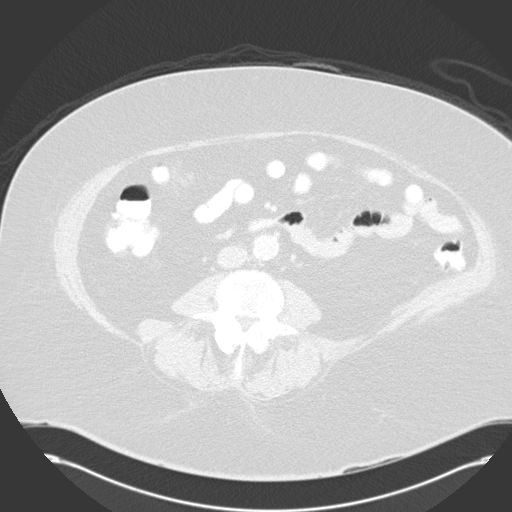
[im 59/128  lung]
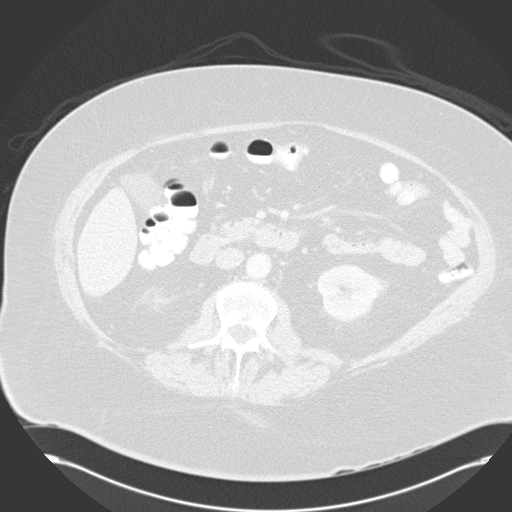
[im 69/128  lung]
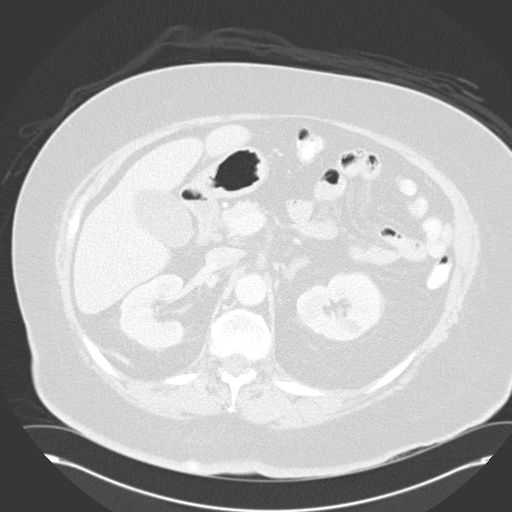
[im 79/128  lung]
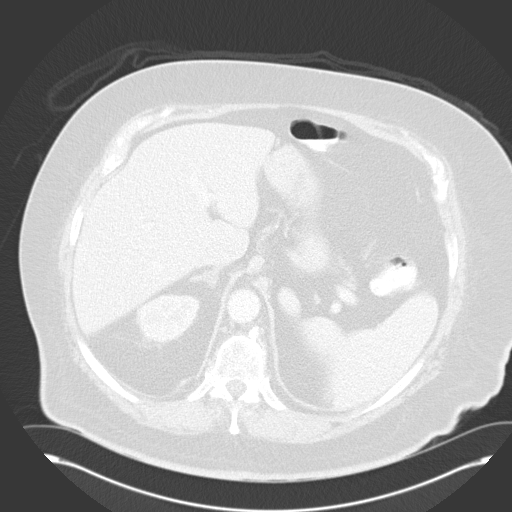
[im 88/128  mediastinal]
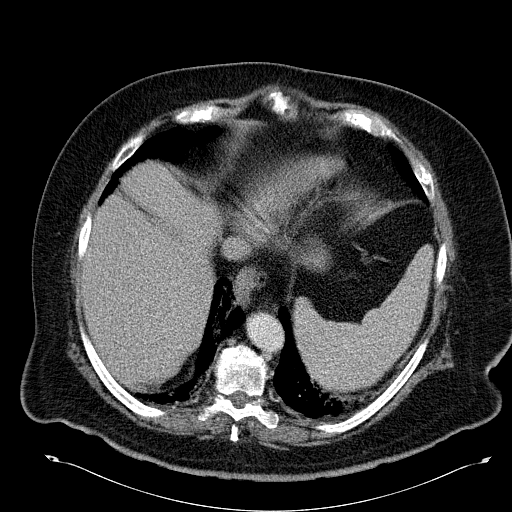
[im 88/128  lung]
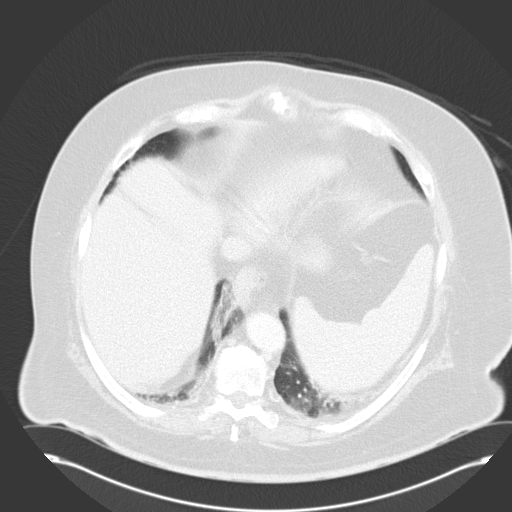
[im 98/128  lung]
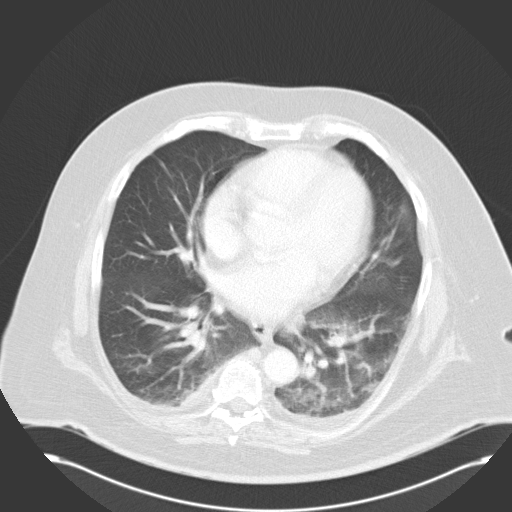
[im 108/128  lung]
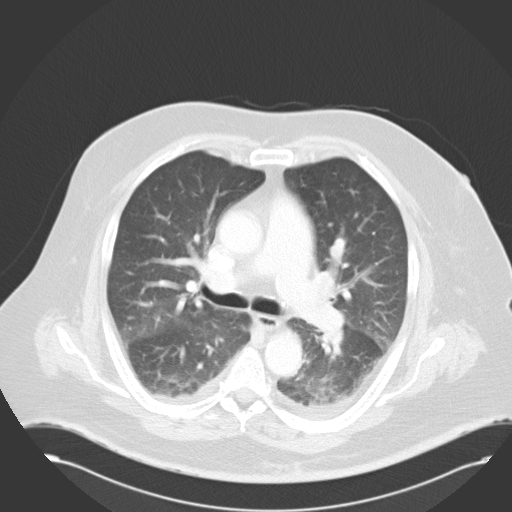
[im 118/128  lung]
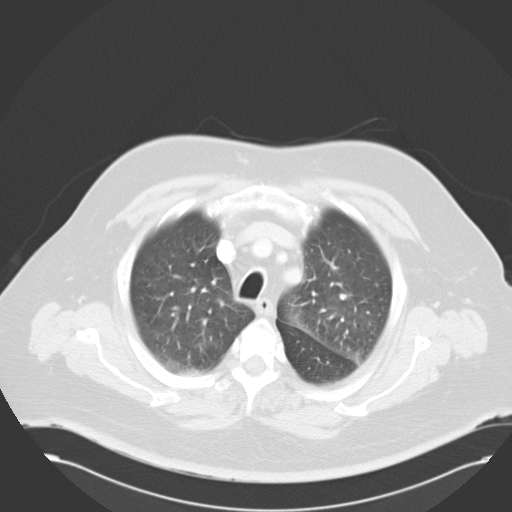

[Series 602: cor · coronal · 1.28mm/px · 3 of 160 slices shown]
[im 32/160  lung]
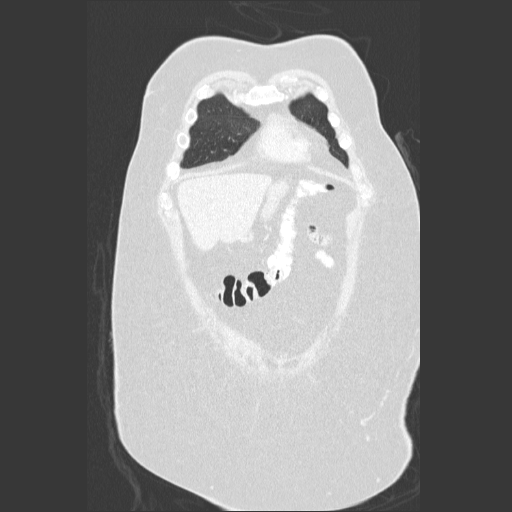
[im 64/160  lung]
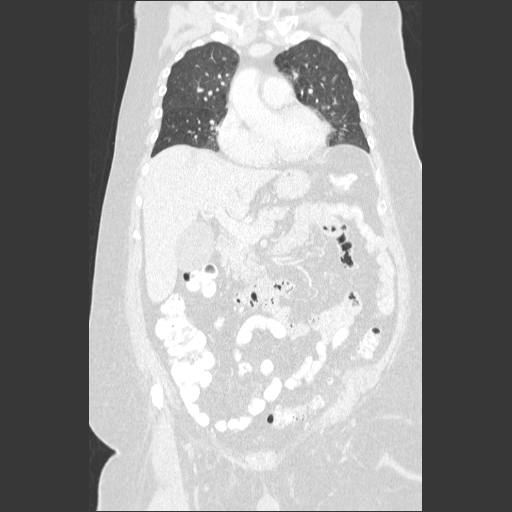
[im 96/160  lung]
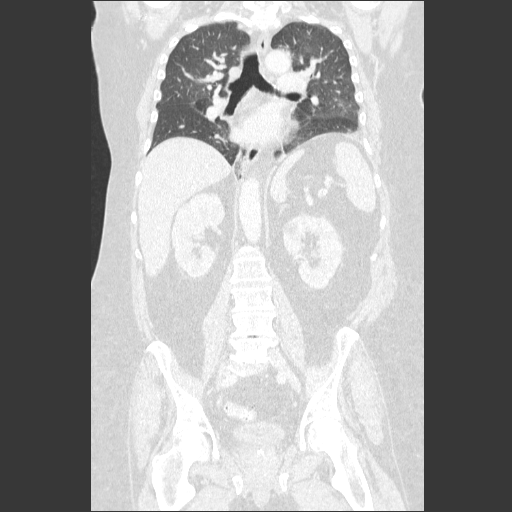

[15 of 36 positions shown; findings below may reference images not displayed]

FINDINGS: CT CHEST FINDINGS

Mediastinum/Lymph Nodes: No pathologically enlarged mediastinal,
hilar or axillary lymph nodes. Although this study is not optimized
for the detection of pulmonary emboli, there is suspected
intraluminal low-attenuation at the bifurcation of the left upper
and left lower lobe pulmonary arteries, extending into the lobar and
segmental pulmonary arteries. Pulmonary arteries are enlarged. Heart
size is at the upper limits of normal. No pericardial effusion. Tiny
hiatal hernia.

Lungs/Pleura: Image quality is degraded by respiratory motion and
expiratory phase imaging. Probable atelectasis in the lower lobes.
Trace bilateral pleural effusions. Airway is unremarkable.

Musculoskeletal: No worrisome lytic or sclerotic lesions.

CT ABDOMEN PELVIS FINDINGS

Hepatobiliary: Liver and gallbladder are unremarkable. No biliary
ductal dilatation.

Pancreas: Negative.

Spleen: Negative.

Adrenals/Urinary Tract: Adrenal glands are unremarkable.
Low-attenuation lesions in the kidneys measure up to 2.6 cm on the
left and are likely cysts, statistically. Punctate stone in the
right kidney. Ureters are decompressed. Bladder is low in volume.

Stomach/Bowel: Small hiatal hernia. Stomach, small bowel and colon
are unremarkable. Appendix is not readily visualized.

Vascular/Lymphatic: Atherosclerotic calcification of the arterial
vasculature without abdominal aortic aneurysm. No pathologically
enlarged lymph nodes.

Reproductive: Prostate is normal in size.

Other: No free fluid.  Mesenteries and peritoneum are unremarkable.

Musculoskeletal: No worrisome lytic or sclerotic lesions. Grade 1
anterolisthesis of L4 on L5.
IMPRESSION: 1. Although this study is not optimized for the detection of
pulmonary emboli, there is low-attenuation in the left pulmonary
arterial tree, suspicious for pulmonary emboli. Critical
Value/emergent results were called by telephone at the time of
interpretation on [DATE] at [DATE] to Dr. VISCONTI , who
verbally acknowledged these results.
2. Mild bilateral lower lobe atelectasis and trace bilateral pleural
effusions.
3. Enlarged pulmonary arteries, indicative of pulmonary arterial
hypertension.
4. Punctate right renal stone.

## 2015-05-09 MED ORDER — POTASSIUM & SODIUM PHOSPHATES 280-160-250 MG PO PACK
1.0000 | PACK | Freq: Three times a day (TID) | ORAL | Status: AC
  Administered 2015-05-09 – 2015-05-10 (×3): 1 via ORAL
  Filled 2015-05-09 (×3): qty 1

## 2015-05-09 MED ORDER — ENOXAPARIN SODIUM 60 MG/0.6ML ~~LOC~~ SOLN
60.0000 mg | SUBCUTANEOUS | Status: AC
  Administered 2015-05-09: 60 mg via SUBCUTANEOUS
  Filled 2015-05-09 (×2): qty 0.6

## 2015-05-09 MED ORDER — SODIUM CHLORIDE 0.9 % IV BOLUS (SEPSIS)
500.0000 mL | Freq: Once | INTRAVENOUS | Status: AC
  Administered 2015-05-09: 500 mL via INTRAVENOUS

## 2015-05-09 MED ORDER — BENZONATATE 100 MG PO CAPS
200.0000 mg | ORAL_CAPSULE | Freq: Two times a day (BID) | ORAL | Status: DC
  Administered 2015-05-09 – 2015-05-12 (×7): 200 mg via ORAL
  Filled 2015-05-09 (×8): qty 2

## 2015-05-09 MED ORDER — ENOXAPARIN SODIUM 60 MG/0.6ML ~~LOC~~ SOLN
60.0000 mg | SUBCUTANEOUS | Status: AC
Start: 2015-05-09 — End: 2015-05-09
  Administered 2015-05-09: 60 mg via SUBCUTANEOUS
  Filled 2015-05-09: qty 0.6

## 2015-05-09 MED ORDER — DIATRIZOATE MEGLUMINE & SODIUM 66-10 % PO SOLN: 30.0000 mL | Freq: Once | ORAL | Status: DC

## 2015-05-09 MED ORDER — ENOXAPARIN SODIUM 120 MG/0.8ML ~~LOC~~ SOLN
120.0000 mg | Freq: Two times a day (BID) | SUBCUTANEOUS | Status: DC
Start: 2015-05-10 — End: 2015-05-11
  Administered 2015-05-10 – 2015-05-11 (×3): 120 mg via SUBCUTANEOUS
  Filled 2015-05-09 (×4): qty 0.8

## 2015-05-09 MED ORDER — IOPAMIDOL (ISOVUE-300) INJECTION 61%
100.0000 mL | Freq: Once | INTRAVENOUS | Status: AC | PRN
  Administered 2015-05-09: 100 mL via INTRAVENOUS

## 2015-05-09 MED ORDER — DIATRIZOATE MEGLUMINE & SODIUM 66-10 % PO SOLN
15.0000 mL | ORAL | Status: AC
  Administered 2015-05-09 (×2): 15 mL via ORAL
  Filled 2015-05-09 (×2): qty 30

## 2015-05-09 NOTE — Evaluation (Signed)
Physical Therapy Evaluation Patient Details Name: Alan [name] MRN: [id number] DOB: 20-Oct-1940 Today's Date: 05/09/2015   History of Present Illness  75 yo male admitted with SIRS, bronchitis, hypotension. Hx of HTN, hearing loss, arthritis, L knee instability  Clinical Impression  On eval, pt required Min guard assist for mobility-walked ~125 feet. Unsteady at times with pt having to hold on to hallway handrail intermittently to steady himself. O2 sats 87% on RA, dyspnea 2/4 with ambulation. Recommend HHPT.    Follow Up Recommendations Home health PT;Supervision/Assistance - 24 hour (for first few days, then intermittent assist/supervision should be appropriate).    Equipment Recommendations  None recommended by PT    Recommendations for Other Services       Precautions / Restrictions Precautions Precautions: Fall Precaution Comments: droplet Restrictions Weight Bearing Restrictions: No      Mobility  Bed Mobility Overal bed mobility: Needs Assistance Bed Mobility: Supine to Sit     Supine to sit: Supervision     General bed mobility comments: for safety  Transfers Overall transfer level: Needs assistance   Transfers: Sit to/from Stand Sit to Stand: Min guard         General transfer comment: close guard for safety. Increased time  Ambulation/Gait Ambulation/Gait assistance: Min guard Ambulation Distance (Feet): 125 Feet Assistive device: None (intermittent use of handrail for steadying) Gait Pattern/deviations: Step-through pattern;Decreased stride length;Drifts right/left     General Gait Details: slow gait speed. Dyspnea 2/4. O2 sats 87% on RA. Pt fatigues fairly easily.   Stairs            Wheelchair Mobility    Modified Rankin (Stroke Patients Only)       Balance Overall balance assessment: Needs assistance         Standing balance support: During functional activity Standing balance-Leahy Scale: Fair                                Pertinent Vitals/Pain Pain Assessment: Faces Faces Pain Scale: Hurts little more Pain Location: head Pain Descriptors / Indicators: Aching Pain Intervention(s): Monitored during session;Limited activity within patient's tolerance;Repositioned    Home Living Family/patient expects to be discharged to:: Private residence Living Arrangements:  (daughter sometimes stays with pt) Available Help at Discharge: Family;Available PRN/intermittently Type of Home: House Home Access: Stairs to enter Entrance Stairs-Rails: None Entrance Stairs-Number of Steps: 2-3 Home Layout: Two level;Bed/bath upstairs Home Equipment: None      Prior Function Level of Independence: Independent               Hand Dominance        Extremity/Trunk Assessment   Upper Extremity Assessment: Overall WFL for tasks assessed           Lower Extremity Assessment: Generalized weakness      Cervical / Trunk Assessment: Normal  Communication   Communication: HOH  Cognition Arousal/Alertness: Awake/alert Behavior During Therapy: WFL for tasks assessed/performed Overall Cognitive Status: Within Functional Limits for tasks assessed                      General Comments      Exercises        Assessment/Plan    PT Assessment Patient needs continued PT services  PT Diagnosis Difficulty walking;Generalized weakness;Acute pain   PT Problem List Decreased strength;Decreased activity tolerance;Decreased balance;Decreased mobility;Obesity;Pain  PT Treatment Interventions Gait training;Functional mobility training;Therapeutic activities;Patient/family education;Balance training;Therapeutic exercise  PT Goals (Current goals can be found in the Care Plan section) Acute Rehab PT Goals Patient Stated Goal: to get stronger. regain PLOF. PT Goal Formulation: With patient/family Time For Goal Achievement: 05/23/15 Potential to Achieve Goals: Good    Frequency Min 3X/week    Barriers to discharge        Co-evaluation               End of Session Equipment Utilized During Treatment: Gait belt   Patient left: in chair;with call bell/phone within reach;with family/visitor present           Time: 1340-1403 PT Time Calculation (min) (ACUTE ONLY): 23 min   Charges:   PT Evaluation $PT Eval Low Complexity: 1 Procedure     PT G Codes:        Weston Anna, MPT Pager: [phone]

## 2015-05-09 NOTE — Progress Notes (Signed)
ANTICOAGULATION CONSULT NOTE - Initial Consult  Pharmacy Consult for Lovenox Indication: pulmonary embolus  Allergies  Allergen Reactions  . Shellfish Allergy Nausea And Vomiting    Patient Measurements: Height: 5\' 7"  (170.2 cm) Weight: 268 lb 9.6 oz (121.836 kg) IBW/kg (Calculated) : 66.1  Vital Signs: Temp: 101.2 F (38.4 C) (05/03 1411) Temp Source: Oral (05/03 1411) BP: 129/58 mmHg (05/03 1411) Pulse Rate: 106 (05/03 1411)  Labs:  Recent Labs  05/07/15 2116 05/08/15 0508 05/09/15 0516  HGB 13.3 13.8  --   HCT 38.8* 39.9  --   PLT 164 160  --   LABPROT  --  13.8  --   INR  --  1.04  --   CREATININE 1.38* 1.27* 0.93    Estimated Creatinine Clearance: 84.5 mL/min (by C-G formula based on Cr of 0.93).   Medical History: Past Medical History  Diagnosis Date  . Hypertension   . Allergy   . Arthritis   . Cancer ([place])   . Cataract     Medications:  Scheduled:  . aspirin  81 mg Oral Daily  . benzonatate  200 mg Oral BID  . doxycycline  100 mg Oral Q12H  . enoxaparin (LOVENOX) injection  0.5 mg/kg Subcutaneous Q24H  . pantoprazole  40 mg Oral Daily  . polyethylene glycol  17 g Oral BID  . potassium & sodium phosphates  1 packet Oral TID WC & HS  . senna  1 tablet Oral QHS  . sodium chloride flush  3 mL Intravenous Q12H   Infusions:  . sodium chloride 75 mL/hr at 05/09/15 0027    Assessment: 33 yoM admitted on 5/1 with hypotension and was being treated for bronchitis.  For unintentional weight loss, he had CT chest and abdomen pelvis.  CT chest not optimized for detection, but suspicious for PE.  Planning to confirm with CT angio, but will wait until tomorrow to prevent kidney injury with multiple doses of contrast.  Pharmacy is consulted to dose Lovenox for suspected PE.  He was initially started on VTE prophylaxis with Lovenox 30mg  q24h d/t poor renal function.  Lovenox dose was increased (0.5 mg/kg, 60mg ) d/t improved renal function and obesity, with  last dose on 5/3 at 0900.    Today, 05/09/2015: SCr elevated on admission, 1.38, but SCr now improved to 0.93 CrCl ~ 84 ml/min CBC: Hgb 13.8 and Plt 160, stable   Goal of Therapy:  Heparin level 0.3-0.7 units/ml Monitor platelets by anticoagulation protocol: Yes   Plan:   Lovenox 60mg  SQ x1 dose stat, then start Lovenox 120mg  SQ q12h  Follow up renal function closely   Follow up [place] PharmD, BCPS Pager [phone] 05/09/2015 2:55 PM   Addendum: Additional afternoon STAT Lovenox 60mg  dose given late (instead of ~6 hours after AM dose, was ~8 hours).  To get back on a regular schedule, give one additional 60mg  dose tonight, then restart full dose Lovenox 120mg  q12h on 5/4 at 0600.  Gretta Arab PharmD, BCPS Pager [phone] 05/09/2015 7:59 PM

## 2015-05-09 NOTE — Progress Notes (Signed)
Pt had temp of 100F at beginning of shift, gave PRN tylenol and dropped to 99.61F. Temp went back up to 101 during morning check of vitals, PRN tylenol given again and paged on-call. Got order for blood cultures and 500 cc bolus. Will continue to monitor closely.

## 2015-05-09 NOTE — Care Management Note (Signed)
Case Management Note  Patient Details  Name: Alan [name] MRN: [id number] Date of Birth: 1940/03/09  Subjective/Objective: PT recc HHPT. Provied patient/dtr w/HHC provider list-await choice. MD notified for HHPT,[id number] order.                   Action/Plan:d/c home w/HHPT.   Expected Discharge Date:                  Expected Discharge Plan:  [place]  In-House Referral:     Discharge planning Services  CM Consult  Post Acute Care Choice:    Choice offered to:  Patient  DME Arranged:    DME Agency:     HH Arranged:    [place] Agency:     Status of Service:  In process, will continue to follow  Medicare Important Message Given:    Date Medicare IM Given:    Medicare IM give by:    Date Additional Medicare IM Given:    Additional Medicare Important Message give by:     If discussed at [place] of Stay Meetings, dates discussed:    Additional Comments:  Dessa Phi, RN 05/09/2015, 2:59 PM

## 2015-05-09 NOTE — Progress Notes (Addendum)
PROGRESS NOTE    BRETTEN SCHNEEBERGER  [id number] DOB: 29-Nov-1940 DOA: 05/07/2015 PCP: Criselda Peaches, MD  Outpatient Specialists: Dr. Constance Holster, ENT  Brief Narrative: 75 year old male with uncomplicated past medical history, direct admission for relative hypotension.  Assessment & Plan   Hypotension -Likely secondary to dehydration. -BP responding to IV fluids -Orthostatic vitals unremarkable  Fever -Obtained blood cultures -Currently on doxycycline -obtain CT chest/abd/pelvis -Chest x-ray UA unremarkable for infection  Acute bronchitis -Appears to be improving. Continue bronchodilators, antibiotics -Chest x-ray remarkable for infection, suggestive of airway disease/bronchitis -Patient does have mild cough, will order antitussives  Hypokalemia/hypophosphatemia -Likely secondary to diuretics -Replacing continue to monitor  Isolated hyperbilirubinemia -Appears to be improving, no complaints of abdominal pain -Likely secondary to dehydration -Abdominal x-ray unremarkable  Idiopathic sudden hearing loss -Patient follows with Dr. Constance Holster, ENT -Was treated with steroids (prednisone) for extended period of time  Constipation -was given suppository, and was able to have bowel movement  Unintentional weight loss -Given the patient does have history of melanoma, obtain CT chest and abdomen pelvis with contrast  DVT Prophylaxis  Lovenox  Code Status: Full  Family Communication: None at bedside.  Disposition Plan: Admitted. Pending improvement. Expect discharge within 1-2 days  Consultants None  Procedures  None  Antibiotics   Anti-infectives    Start     Dose/Rate Route Frequency Ordered Stop   05/08/15 0145  doxycycline (VIBRA-TABS) tablet 100 mg     100 mg Oral Every 12 hours 05/08/15 0140        Subjective:   Alan [name] seen and examined today.  Feels improved as compared to prior days however does not feel back to his baseline. Denies chest pain, shortness of  breath, abdominal pain, nausea or vomiting. Was able to have a bowel movement yesterday.  Objective:   Filed Vitals:   05/08/15 2201 05/08/15 2303 05/09/15 0618 05/09/15 0745  BP:   100/58   Pulse:   92   Temp: 100.6 F (38.1 C) 99.6 F (37.6 C) 101 F (38.3 C) 99.1 F (37.3 C)  TempSrc: Oral Oral Oral Oral  Resp:   18   Height:      Weight:      SpO2: 95%  90%     Intake/Output Summary (Last 24 hours) at 05/09/15 1203 Last data filed at 05/09/15 1100  Gross per 24 hour  Intake 1716.25 ml  Output    800 ml  Net 916.25 ml   Filed Weights   05/07/15 1839  Weight: 121.836 kg (268 lb 9.6 oz)    Exam  General: Well developed, well nourished, NAD, appears stated age  HEENT: NCAT, mucous membranes moist.   Cardiovascular: S1 S2 auscultated, no rubs, murmurs or gallops. Regular rate and rhythm.  Respiratory: Diminished but clear  Abdomen: Soft, nontender, nondistended, + bowel sounds  Extremities: warm dry without cyanosis clubbing or edema  Neuro: AAOx3, nonfocal  Psych: Normal affect and demeanor, pleasant   Data Reviewed: I have personally reviewed following labs and imaging studies  CBC:  Recent Labs Lab 05/07/15 2116 05/08/15 0508  WBC 7.1 8.1  NEUTROABS 4.9  --   HGB 13.3 13.8  HCT 38.8* 39.9  MCV 90.4 90.7  PLT 164 0000000   Basic Metabolic Panel:  Recent Labs Lab 05/07/15 2116 05/08/15 0508 05/09/15 0516  NA 138 139 142  K 2.8* 3.3* 3.2*  CL 100* 102 105  CO2 27 25 27   GLUCOSE 128* 113* 127*  BUN 35* 34*  24*  CREATININE 1.38* 1.27* 0.93  CALCIUM 9.3 9.3 8.8*  MG  --  1.8  1.6*  --   PHOS  --  1.8*  1.8*  --    GFR: Estimated Creatinine Clearance: 84.5 mL/min (by C-G formula based on Cr of 0.93). Liver Function Tests:  Recent Labs Lab 05/07/15 2116 05/08/15 0508 05/09/15 0516  AST 25 27 25   ALT 26 24 25   ALKPHOS 49 50 50  BILITOT 2.0* 2.0* 1.1  PROT 6.0* 5.9* 5.4*  ALBUMIN 3.2* 3.0* 2.7*   No results for input(s):  LIPASE, AMYLASE in the last 168 hours. No results for input(s): AMMONIA in the last 168 hours. Coagulation Profile:  Recent Labs Lab 05/08/15 0508  INR 1.04   Cardiac Enzymes: No results for input(s): CKTOTAL, CKMB, CKMBINDEX, TROPONINI in the last 168 hours. BNP (last 3 results) No results for input(s): PROBNP in the last 8760 hours. HbA1C:  Recent Labs  05/08/15 0508  HGBA1C 6.4*   CBG: No results for input(s): GLUCAP in the last 168 hours. Lipid Profile: No results for input(s): CHOL, HDL, LDLCALC, TRIG, CHOLHDL, LDLDIRECT in the last 72 hours. Thyroid Function Tests:  Recent Labs  05/08/15 0508  TSH 1.514   Anemia Panel: No results for input(s): VITAMINB12, FOLATE, FERRITIN, TIBC, IRON, RETICCTPCT in the last 72 hours. Urine analysis:    Component Value Date/Time   COLORURINE AMBER* 05/07/2015 2223   APPEARANCEUR CLOUDY* 05/07/2015 2223   LABSPEC 1.024 05/07/2015 2223   PHURINE 5.5 05/07/2015 2223   GLUCOSEU NEGATIVE 05/07/2015 2223   HGBUR NEGATIVE 05/07/2015 2223   BILIRUBINUR SMALL* 05/07/2015 2223   KETONESUR 15* 05/07/2015 2223   PROTEINUR 30* 05/07/2015 2223   NITRITE NEGATIVE 05/07/2015 2223   LEUKOCYTESUR TRACE* 05/07/2015 2223   Sepsis Labs: @LABRCNTIP (procalcitonin:4,lacticidven:4)  ) Recent Results (from the past 240 hour(s))  Urine culture     Status: Abnormal   Collection Time: 05/07/15 10:23 PM  Result Value Ref Range Status   Specimen Description URINE, RANDOM  Final   Special Requests NONE  Final   Culture MULTIPLE SPECIES PRESENT, SUGGEST RECOLLECTION (A)  Final   Report Status 05/09/2015 FINAL  Final      Radiology Studies: X-ray Chest Pa And Lateral  05/07/2015  CLINICAL DATA:  Cough and fever for the past 2 days. History of hypertension. EXAM: CHEST  2 VIEW COMPARISON:  None. FINDINGS: Normal cardiac silhouette and mediastinal contours given slightly kyphotic projection. Atherosclerotic plaque within the thoracic aorta. Mild  diffuse slightly nodular thickening of the pulmonary interstitium. No focal airspace opacities. No pleural effusion or pneumothorax. No evidence of edema. No acute osseus abnormalities. Old left seventh rib fracture. IMPRESSION: Findings suggestive of airways disease / bronchitis. No focal airspace opacities to suggest pneumonia. Electronically Signed   By: Sandi Mariscal M.D.   On: 05/07/2015 23:13   Dg Abd 1 View  05/08/2015  CLINICAL DATA:  Nausea.  Bloating and constipation. EXAM: ABDOMEN - 1 VIEW COMPARISON:  CT of 12/17/2010 FINDINGS: Two supine views. Non-obstructive bowel gas pattern. Distal stool and gas. No abnormal abdominal calcifications. No appendicolith. Phleboliths in the pelvis. IMPRESSION: No acute findings. Electronically Signed   By: Abigail Miyamoto M.D.   On: 05/08/2015 10:38     Scheduled Meds: . aspirin  81 mg Oral Daily  . benzonatate  200 mg Oral BID  . diatrizoate meglumine-sodium  15 mL Oral Q1 Hr x 2  . doxycycline  100 mg Oral Q12H  . enoxaparin (LOVENOX)  injection  0.5 mg/kg Subcutaneous Q24H  . pantoprazole  40 mg Oral Daily  . polyethylene glycol  17 g Oral BID  . senna  1 tablet Oral QHS  . sodium chloride flush  3 mL Intravenous Q12H   Continuous Infusions: . sodium chloride 75 mL/hr at 05/09/15 0027     LOS: 2 days   Time Spent in minutes   30 minutes  Briannon Boggio D.O. on 05/09/2015 at 12:03 PM  Between 7am to 7pm - Pager - [phone]  After 7pm go to [email or web address] - password [email or web address]  And look for the night coverage person covering for me after hours  [facility] Office  [phone]

## 2015-05-10 ENCOUNTER — Inpatient Hospital Stay (HOSPITAL_COMMUNITY): Payer: Medicare Other

## 2015-05-10 DIAGNOSIS — E876 Hypokalemia: Secondary | ICD-10-CM

## 2015-05-10 DIAGNOSIS — I2699 Other pulmonary embolism without acute cor pulmonale: Secondary | ICD-10-CM

## 2015-05-10 LAB — CBC
HCT: 31.9 % — ABNORMAL LOW (ref 39.0–52.0)
Hemoglobin: 11 g/dL — ABNORMAL LOW (ref 13.0–17.0)
MCH: 30.9 pg (ref 26.0–34.0)
MCHC: 34.5 g/dL (ref 30.0–36.0)
MCV: 89.6 fL (ref 78.0–100.0)
Platelets: 166 10*3/uL (ref 150–400)
RBC: 3.56 MIL/uL — ABNORMAL LOW (ref 4.22–5.81)
RDW: 13.5 % (ref 11.5–15.5)
WBC: 5.3 10*3/uL (ref 4.0–10.5)

## 2015-05-10 LAB — COMPREHENSIVE METABOLIC PANEL
ALT: 26 U/L (ref 17–63)
AST: 26 U/L (ref 15–41)
Albumin: 2.3 g/dL — ABNORMAL LOW (ref 3.5–5.0)
Alkaline Phosphatase: 52 U/L (ref 38–126)
Anion gap: 8 (ref 5–15)
BUN: 13 mg/dL (ref 6–20)
CO2: 28 mmol/L (ref 22–32)
Calcium: 8.4 mg/dL — ABNORMAL LOW (ref 8.9–10.3)
Chloride: 106 mmol/L (ref 101–111)
Creatinine, Ser: 0.73 mg/dL (ref 0.61–1.24)
GFR calc Af Amer: >60 mL/min (ref >60)
GFR calc non Af Amer: >60 mL/min (ref >60)
Glucose, Bld: 132 mg/dL — ABNORMAL HIGH (ref 65–99)
Potassium: 3 mmol/L — ABNORMAL LOW (ref 3.5–5.1)
Sodium: 142 mmol/L (ref 135–145)
Total Bilirubin: 1 mg/dL (ref 0.3–1.2)
Total Protein: 5 g/dL — ABNORMAL LOW (ref 6.5–8.1)

## 2015-05-10 LAB — PHOSPHORUS: PHOSPHORUS: 1.5 mg/dL — AB (ref 2.5–4.6)

## 2015-05-10 MED ORDER — IOPAMIDOL (ISOVUE-370) INJECTION 76%
100.0000 mL | Freq: Once | INTRAVENOUS | Status: AC | PRN
  Administered 2015-05-10: 100 mL via INTRAVENOUS

## 2015-05-10 MED ORDER — POTASSIUM CHLORIDE CRYS ER 20 MEQ PO TBCR
40.0000 meq | EXTENDED_RELEASE_TABLET | Freq: Once | ORAL | Status: AC
  Administered 2015-05-10: 40 meq via ORAL
  Filled 2015-05-10: qty 2

## 2015-05-10 NOTE — Care Management Note (Signed)
Case Management Note  Patient Details  Name: JLON QUERRY MRN: [id number] Date of Birth: Mar 17, 1940  Subjective/Objective: Referral for benefit check:xarelto,eliquis,pradaxa-await outcome. Please put in HHPT,f58f orders.                  Action/Plan:d/c home w/HHC.   Expected Discharge Date:                  Expected Discharge Plan:  [place]  In-House Referral:     Discharge planning Services  CM Consult  Post Acute Care Choice:    Choice offered to:  Patient  DME Arranged:    DME Agency:     HH Arranged:    [place] Agency:     Status of Service:  In process, will continue to follow  Medicare Important Message Given:    Date Medicare IM Given:    Medicare IM give by:    Date Additional Medicare IM Given:    Additional Medicare Important Message give by:     If discussed at [place] of Stay Meetings, dates discussed:    Additional Comments:  Dessa Phi, RN 05/10/2015, 2:52 PM

## 2015-05-10 NOTE — Progress Notes (Signed)
Physical Therapy Treatment Patient Details Name: Alan [name] MRN: [id number] DOB: 04/03/1940 Today's Date: 05/10/2015    History of Present Illness 75 yo male admitted with SIRS, bronchitis, hypotension. Hx of HTN, hearing loss, arthritis, L knee instability    PT Comments    Participating well with therapy. O2 sats dropped to 83% on RA on today while ambulating-made RN and CM aware. Will continue to follow.   Follow Up Recommendations  Home health PT;Supervision/Assistance - 24 hour (initially for a few days)     Equipment Recommendations  None recommended by PT    Recommendations for Other Services       Precautions / Restrictions Precautions Precautions: Fall Restrictions Weight Bearing Restrictions: No    Mobility  Bed Mobility Overal bed mobility: Modified Independent                Transfers Overall transfer level: Needs assistance   Transfers: Sit to/from Stand Sit to Stand: Supervision         General transfer comment: for safety  Ambulation/Gait Ambulation/Gait assistance: Min guard Ambulation Distance (Feet): 125 Feet         General Gait Details: slow gait speed. Dyspnea 2/4. O2 sats 83% on RA. Pt fatigues fairly easily.    Stairs            Wheelchair Mobility    Modified Rankin (Stroke Patients Only)       Balance                                    Cognition Arousal/Alertness: Awake/alert Behavior During Therapy: WFL for tasks assessed/performed Overall Cognitive Status: Within Functional Limits for tasks assessed                      Exercises      General Comments        Pertinent Vitals/Pain Pain Assessment: Faces Faces Pain Scale: Hurts a little bit Pain Location: head (sinus? per pt) Pain Descriptors / Indicators: Aching Pain Intervention(s): Monitored during session    Home Living                      Prior Function            PT Goals (current goals can now be  found in the care plan section) Progress towards PT goals: Progressing toward goals    Frequency  Min 3X/week    PT Plan Current plan remains appropriate    Co-evaluation             End of Session Equipment Utilized During Treatment: Gait belt Activity Tolerance: Patient tolerated treatment well Patient left: in bed;with call bell/phone within reach (with CT scan tech)     Time: [id number] PT Time Calculation (min) (ACUTE ONLY): 21 min  Charges:  $Gait Training: 8-22 mins                    G Codes:      Weston Anna, MPT Pager: [phone]

## 2015-05-10 NOTE — Progress Notes (Signed)
Respiratory panel negative, droplet precautions discontinued.

## 2015-05-10 NOTE — Progress Notes (Signed)
VASCULAR LAB PRELIMINARY  PRELIMINARY  PRELIMINARY  PRELIMINARY  Bilateral lower extremity venous duplex completed.     Right leg:  DVT noted in the popliteal vein and the peroneal veins in the mid calf section. No evidence of superficial thrombosis or Baker's cyst.   Left leg:  No evidence of DVT, superficial thrombosis, or Baker's cyst.  Gave result to patient's nurse @ 11:am.  Janifer Adie, RVT, RDMS 05/10/2015, 11:15 AM

## 2015-05-10 NOTE — Care Management Note (Signed)
Case Management Note  Patient Details  Name: CAMDYN BLOEMKER MRN: [id number] Date of Birth: [birth year]  Subjective/Objective: Currently unable to do benefit check since patient doesn't know his script coverage plan-left message w/Amanda to call the floor with the information. Will check benefit for xarelto,eluquis,pradaxa once all info presented.                   Action/Plan:d/c plan home w/HHC.   Expected Discharge Date:                  Expected Discharge Plan:  [place]  In-House Referral:     Discharge planning Services  CM Consult  Post Acute Care Choice:    Choice offered to:  Patient  DME Arranged:    DME Agency:     HH Arranged:    [place] Agency:     Status of Service:  In process, will continue to follow  Medicare Important Message Given:    Date Medicare IM Given:    Medicare IM give by:    Date Additional Medicare IM Given:    Additional Medicare Important Message give by:     If discussed at [place] of Stay Meetings, dates discussed:    Additional Comments:  Dessa Phi, RN 05/10/2015, 3:28 PM

## 2015-05-10 NOTE — Progress Notes (Signed)
PROGRESS NOTE    DERYN ABRAMOWICZ  [id number] DOB: 1940-11-12 DOA: 05/07/2015 PCP: Criselda Peaches, MD  Outpatient Specialists: Dr. Constance Holster, ENT  Brief Narrative: 75 year old male with uncomplicated past medical history, direct admission for relative hypotension.  Assessment & Plan   Hypotension -Likely secondary to dehydration. -BP responding to IV fluids -Orthostatic vitals unremarkable  ? Pulmonary emboli -CT chest with contrast obtained on 05/09/2015 which was suggestive of PE however not absolute -Will obtain CTA chest -Will obtain lower extremity Doppler to assess for DVT -Patient was placed on full dose Lovenox on 05/09/2015  Fever -Blood cultures pending -?related to DVT or PE -Currently on doxycycline -obtain CT chest/abd/pelvis -Chest x-ray UA unremarkable for infection -urine culture: multiple species  Acute bronchitis -Appears to be improving. Continue bronchodilators, antibiotics -Chest x-ray remarkable for infection, suggestive of airway disease/bronchitis -Patient does have mild cough, will order antitussives -CT chest: Mild bilateral lower lobe atelectasis, trace bilateral pleural effusions. Enlarged pulmonary arteries indicative of pulmonary arterial hypertension  Hypokalemia/hypophosphatemia -Likely secondary to diuretics -Replacing continue to monitor  Isolated hyperbilirubinemia -Appears to be improving, no complaints of abdominal pain -Likely secondary to dehydration -Abdominal x-ray unremarkable  Idiopathic sudden hearing loss -Patient follows with Dr. Constance Holster, ENT -Was treated with steroids (prednisone) for extended period of time  Constipation -was given suppository, and was able to have bowel movement  Unintentional weight loss -Given the patient does have history of melanoma -CT chest, abdomen, pelvis with contrast showed questionable PE, renal stone  Generalized weakness -PT consulted    DVT Prophylaxis  Lovenox  Code Status:  Full  Family Communication: None at bedside. Daughter via phone.  Disposition Plan: Admitted. Pending improvement and CTA chest  Consultants None  Procedures  None  Antibiotics   Anti-infectives    Start     Dose/Rate Route Frequency Ordered Stop   05/08/15 0145  doxycycline (VIBRA-TABS) tablet 100 mg     100 mg Oral Every 12 hours 05/08/15 0140        Subjective:   Levan Hurst seen and examined today.  Feels improved as compared to prior days however still feels weak. Denies chest pain, shortness of breath, abdominal pain, nausea or vomiting.   Objective:   Filed Vitals:   05/09/15 1411 05/09/15 2015 05/09/15 2155 05/10/15 0525  BP: 129/58  122/56 123/71  Pulse: 106  94 84  Temp: 101.2 F (38.4 C) 99 F (37.2 C) 99 F (37.2 C) 98.1 F (36.7 C)  TempSrc: Oral Oral Oral Oral  Resp: 18  20 16   Height:      Weight:      SpO2: 97%  94% 92%    Intake/Output Summary (Last 24 hours) at 05/10/15 1114 Last data filed at 05/10/15 0630  Gross per 24 hour  Intake 2585.42 ml  Output      0 ml  Net 2585.42 ml   Filed Weights   05/07/15 1839  Weight: 121.836 kg (268 lb 9.6 oz)    Exam  General: Well developed, well nourished, NAD  HEENT: NCAT, mucous membranes moist.   Cardiovascular: S1 S2 auscultated, no murmurs, RRR  Respiratory: Diminished but clear, no wheezing or rhonchi.  Abdomen: Soft, obese, nontender, nondistended, + bowel sounds  Extremities: warm dry without cyanosis clubbing or edema  Neuro: AAOx3, nonfocal  Psych: Normal affect and demeanor, pleasant  Data Reviewed: I have personally reviewed following labs and imaging studies  CBC:  Recent Labs Lab 05/07/15 2116 05/08/15 0508 05/10/15 0357  WBC  7.1 8.1 5.3  NEUTROABS 4.9  --   --   HGB 13.3 13.8 11.0*  HCT 38.8* 39.9 31.9*  MCV 90.4 90.7 89.6  PLT 164 160 [id number]   Basic Metabolic Panel:  Recent Labs Lab 05/07/15 2116 05/08/15 0508 05/09/15 0516 05/10/15 0357  NA 138 139 142  142  K 2.8* 3.3* 3.2* 3.0*  CL 100* 102 105 106  CO2 27 25 27 28   GLUCOSE 128* 113* 127* 132*  BUN 35* 34* 24* 13  CREATININE 1.38* 1.27* 0.93 0.73  CALCIUM 9.3 9.3 8.8* 8.4*  MG  --  1.8  1.6*  --   --   PHOS  --  1.8*  1.8*  --  1.5*   GFR: Estimated Creatinine Clearance: 98.2 mL/min (by C-G formula based on Cr of 0.73). Liver Function Tests:  Recent Labs Lab 05/07/15 2116 05/08/15 0508 05/09/15 0516 05/10/15 0357  AST 25 27 25 26   ALT 26 24 25 26   ALKPHOS 49 50 50 52  BILITOT 2.0* 2.0* 1.1 1.0  PROT 6.0* 5.9* 5.4* 5.0*  ALBUMIN 3.2* 3.0* 2.7* 2.3*   No results for input(s): LIPASE, AMYLASE in the last 168 hours. No results for input(s): AMMONIA in the last 168 hours. Coagulation Profile:  Recent Labs Lab 05/08/15 0508  INR 1.04   Cardiac Enzymes: No results for input(s): CKTOTAL, CKMB, CKMBINDEX, TROPONINI in the last 168 hours. BNP (last 3 results) No results for input(s): PROBNP in the last 8760 hours. HbA1C:  Recent Labs  05/08/15 0508  HGBA1C 6.4*   CBG: No results for input(s): GLUCAP in the last 168 hours. Lipid Profile: No results for input(s): CHOL, HDL, LDLCALC, TRIG, CHOLHDL, LDLDIRECT in the last 72 hours. Thyroid Function Tests:  Recent Labs  05/08/15 0508  TSH 1.514   Anemia Panel: No results for input(s): VITAMINB12, FOLATE, FERRITIN, TIBC, IRON, RETICCTPCT in the last 72 hours. Urine analysis:    Component Value Date/Time   COLORURINE AMBER* 05/07/2015 2223   APPEARANCEUR CLOUDY* 05/07/2015 2223   LABSPEC 1.024 05/07/2015 2223   PHURINE 5.5 05/07/2015 2223   GLUCOSEU NEGATIVE 05/07/2015 2223   HGBUR NEGATIVE 05/07/2015 2223   BILIRUBINUR SMALL* 05/07/2015 2223   KETONESUR 15* 05/07/2015 2223   PROTEINUR 30* 05/07/2015 2223   NITRITE NEGATIVE 05/07/2015 2223   LEUKOCYTESUR TRACE* 05/07/2015 2223   Sepsis Labs: @LABRCNTIP (procalcitonin:4,lacticidven:4)  ) Recent Results (from the past 240 hour(s))  Urine culture      Status: Abnormal   Collection Time: 05/07/15 10:23 PM  Result Value Ref Range Status   Specimen Description URINE, RANDOM  Final   Special Requests NONE  Final   Culture MULTIPLE SPECIES PRESENT, SUGGEST RECOLLECTION (A)  Final   Report Status 05/09/2015 FINAL  Final  Respiratory virus panel     Status: None   Collection Time: 05/08/15  3:11 AM  Result Value Ref Range Status   Respiratory Syncytial Virus A Negative Negative Final   Respiratory Syncytial Virus B Negative Negative Final   Influenza A Negative Negative Final   Influenza B Negative Negative Final   Parainfluenza 1 Negative Negative Final   Parainfluenza 2 Negative Negative Final   Parainfluenza 3 Negative Negative Final   Metapneumovirus Negative Negative Final   Rhinovirus Negative Negative Final   Adenovirus Negative Negative Final    Comment: (NOTE) Performed At: [facility] [place], [place] [id number] Lindon Romp MD [id number]       Radiology Studies: Ct Chest W Contrast  05/09/2015  CLINICAL DATA:  Hypotension, malaise, lightheadedness, cough, fever, weight loss. EXAM: CT CHEST, ABDOMEN, AND PELVIS WITH CONTRAST TECHNIQUE: Multidetector CT imaging of the chest, abdomen and pelvis was performed following the standard protocol during bolus administration of intravenous contrast. CONTRAST:  155mL ISOVUE-300 IOPAMIDOL (ISOVUE-300) INJECTION 61% COMPARISON:  None. FINDINGS: CT CHEST FINDINGS Mediastinum/Lymph Nodes: No pathologically enlarged mediastinal, hilar or axillary lymph nodes. Although this study is not optimized for the detection of pulmonary emboli, there is suspected intraluminal low-attenuation at the bifurcation of the left upper and left lower lobe pulmonary arteries, extending into the lobar and segmental pulmonary arteries. Pulmonary arteries are enlarged. Heart size is at the upper limits of normal. No pericardial effusion. Tiny hiatal hernia. Lungs/Pleura: Image quality is  degraded by respiratory motion and expiratory phase imaging. Probable atelectasis in the lower lobes. Trace bilateral pleural effusions. Airway is unremarkable. Musculoskeletal: No worrisome lytic or sclerotic lesions. CT ABDOMEN PELVIS FINDINGS Hepatobiliary: Liver and gallbladder are unremarkable. No biliary ductal dilatation. Pancreas: Negative. Spleen: Negative. Adrenals/Urinary Tract: Adrenal glands are unremarkable. Low-attenuation lesions in the kidneys measure up to 2.6 cm on the left and are likely cysts, statistically. Punctate stone in the right kidney. Ureters are decompressed. Bladder is low in volume. Stomach/Bowel: Small hiatal hernia. Stomach, small bowel and colon are unremarkable. Appendix is not readily visualized. Vascular/Lymphatic: Atherosclerotic calcification of the arterial vasculature without abdominal aortic aneurysm. No pathologically enlarged lymph nodes. Reproductive: Prostate is normal in size. Other: No free fluid.  Mesenteries and peritoneum are unremarkable. Musculoskeletal: No worrisome lytic or sclerotic lesions. Grade 1 anterolisthesis of L4 on L5. IMPRESSION: 1. Although this study is not optimized for the detection of pulmonary emboli, there is low-attenuation in the left pulmonary arterial tree, suspicious for pulmonary emboli. Critical Value/emergent results were called by telephone at the time of interpretation on 05/09/2015 at 2:43 pm to Dr. Cristal Ford , who verbally acknowledged these results. 2. Mild bilateral lower lobe atelectasis and trace bilateral pleural effusions. 3. Enlarged pulmonary arteries, indicative of pulmonary arterial hypertension. 4. Punctate right renal stone. Electronically Signed   By: Lorin Picket M.D.   On: 05/09/2015 14:46   Ct Abdomen Pelvis W Contrast  05/09/2015  CLINICAL DATA:  Hypotension, malaise, lightheadedness, cough, fever, weight loss. EXAM: CT CHEST, ABDOMEN, AND PELVIS WITH CONTRAST TECHNIQUE: Multidetector CT imaging of the  chest, abdomen and pelvis was performed following the standard protocol during bolus administration of intravenous contrast. CONTRAST:  13mL ISOVUE-300 IOPAMIDOL (ISOVUE-300) INJECTION 61% COMPARISON:  None. FINDINGS: CT CHEST FINDINGS Mediastinum/Lymph Nodes: No pathologically enlarged mediastinal, hilar or axillary lymph nodes. Although this study is not optimized for the detection of pulmonary emboli, there is suspected intraluminal low-attenuation at the bifurcation of the left upper and left lower lobe pulmonary arteries, extending into the lobar and segmental pulmonary arteries. Pulmonary arteries are enlarged. Heart size is at the upper limits of normal. No pericardial effusion. Tiny hiatal hernia. Lungs/Pleura: Image quality is degraded by respiratory motion and expiratory phase imaging. Probable atelectasis in the lower lobes. Trace bilateral pleural effusions. Airway is unremarkable. Musculoskeletal: No worrisome lytic or sclerotic lesions. CT ABDOMEN PELVIS FINDINGS Hepatobiliary: Liver and gallbladder are unremarkable. No biliary ductal dilatation. Pancreas: Negative. Spleen: Negative. Adrenals/Urinary Tract: Adrenal glands are unremarkable. Low-attenuation lesions in the kidneys measure up to 2.6 cm on the left and are likely cysts, statistically. Punctate stone in the right kidney. Ureters are decompressed. Bladder is low in volume. Stomach/Bowel: Small hiatal hernia. Stomach, small bowel and colon  are unremarkable. Appendix is not readily visualized. Vascular/Lymphatic: Atherosclerotic calcification of the arterial vasculature without abdominal aortic aneurysm. No pathologically enlarged lymph nodes. Reproductive: Prostate is normal in size. Other: No free fluid.  Mesenteries and peritoneum are unremarkable. Musculoskeletal: No worrisome lytic or sclerotic lesions. Grade 1 anterolisthesis of L4 on L5. IMPRESSION: 1. Although this study is not optimized for the detection of pulmonary emboli, there is  low-attenuation in the left pulmonary arterial tree, suspicious for pulmonary emboli. Critical Value/emergent results were called by telephone at the time of interpretation on 05/09/2015 at 2:43 pm to Dr. Cristal Ford , who verbally acknowledged these results. 2. Mild bilateral lower lobe atelectasis and trace bilateral pleural effusions. 3. Enlarged pulmonary arteries, indicative of pulmonary arterial hypertension. 4. Punctate right renal stone. Electronically Signed   By: Lorin Picket M.D.   On: 05/09/2015 14:46     Scheduled Meds: . aspirin  81 mg Oral Daily  . benzonatate  200 mg Oral BID  . doxycycline  100 mg Oral Q12H  . enoxaparin (LOVENOX) injection  120 mg Subcutaneous Q12H  . pantoprazole  40 mg Oral Daily  . polyethylene glycol  17 g Oral BID  . senna  1 tablet Oral QHS  . sodium chloride flush  3 mL Intravenous Q12H   Continuous Infusions: . sodium chloride 100 mL/hr at 05/10/15 0225     LOS: 3 days   Time Spent in minutes   30 minutes  Syrah Daughtrey D.O. on 05/10/2015 at 11:14 AM  Between 7am to 7pm - Pager - [phone]  After 7pm go to [email or web address] - password [email or web address]  And look for the night coverage person covering for me after hours  [facility] Office  [phone]

## 2015-05-11 ENCOUNTER — Inpatient Hospital Stay (HOSPITAL_COMMUNITY): Payer: Medicare Other

## 2015-05-11 DIAGNOSIS — I2699 Other pulmonary embolism without acute cor pulmonale: Secondary | ICD-10-CM

## 2015-05-11 DIAGNOSIS — I82401 Acute embolism and thrombosis of unspecified deep veins of right lower extremity: Secondary | ICD-10-CM

## 2015-05-11 LAB — COMPREHENSIVE METABOLIC PANEL
ALBUMIN: 2.3 g/dL — AB (ref 3.5–5.0)
ALK PHOS: 49 U/L (ref 38–126)
ALT: 27 U/L (ref 17–63)
AST: 25 U/L (ref 15–41)
Anion gap: 10 (ref 5–15)
BUN: 13 mg/dL (ref 6–20)
CALCIUM: 8.5 mg/dL — AB (ref 8.9–10.3)
CO2: 25 mmol/L (ref 22–32)
CREATININE: 0.74 mg/dL (ref 0.61–1.24)
Chloride: 109 mmol/L (ref 101–111)
GFR calc non Af Amer: >60 mL/min (ref >60)
GLUCOSE: 104 mg/dL — AB (ref 65–99)
Potassium: 3.6 mmol/L (ref 3.5–5.1)
SODIUM: 144 mmol/L (ref 135–145)
Total Bilirubin: 0.7 mg/dL (ref 0.3–1.2)
Total Protein: 5 g/dL — ABNORMAL LOW (ref 6.5–8.1)

## 2015-05-11 LAB — CBC
HCT: 34.9 % — ABNORMAL LOW (ref 39.0–52.0)
HEMOGLOBIN: 11.7 g/dL — AB (ref 13.0–17.0)
MCH: 30.5 pg (ref 26.0–34.0)
MCHC: 33.5 g/dL (ref 30.0–36.0)
MCV: 90.9 fL (ref 78.0–100.0)
Platelets: 183 10*3/uL (ref 150–400)
RBC: 3.84 MIL/uL — AB (ref 4.22–5.81)
RDW: 13.7 % (ref 11.5–15.5)
WBC: 4.9 10*3/uL (ref 4.0–10.5)

## 2015-05-11 LAB — ECHOCARDIOGRAM COMPLETE
Height: 67 in
Weight: 4297.6 oz

## 2015-05-11 LAB — PHOSPHORUS: Phosphorus: 1.7 mg/dL — ABNORMAL LOW (ref 2.5–4.6)

## 2015-05-11 MED ORDER — APIXABAN 5 MG PO TABS
10.0000 mg | ORAL_TABLET | Freq: Two times a day (BID) | ORAL | Status: DC
  Administered 2015-05-11 – 2015-05-12 (×2): 10 mg via ORAL
  Filled 2015-05-11 (×2): qty 2

## 2015-05-11 MED ORDER — APIXABAN 5 MG PO TABS: 5.0000 mg | ORAL_TABLET | Freq: Two times a day (BID) | ORAL | Status: DC

## 2015-05-11 NOTE — Care Management Note (Signed)
Case Management Note  Patient Details  Name: YOSMAR JOHNES MRN: [id number] Date of Birth: 03-12-1940  Subjective/Objective:  Benefit check outcome-xarelto/eliquis $45-pre auth needed doctor provided w/tel# [phone].MD chose Eliquis @ d/c.Provided patient w/eliquis 30day free trial coupon.[facility] following for HHPT.                   Action/Plan:d/c home w/HHC.   Expected Discharge Date:                  Expected Discharge Plan:  [place]  In-House Referral:     Discharge planning Services  CM Consult  Post Acute Care Choice:    Choice offered to:  Patient  DME Arranged:    DME Agency:     HH Arranged:    [place] Agency:     Status of Service:  In process, will continue to follow  Medicare Important Message Given:    Date Medicare IM Given:    Medicare IM give by:    Date Additional Medicare IM Given:    Additional Medicare Important Message give by:     If discussed at [place] of Stay Meetings, dates discussed:    Additional Comments:  Dessa Phi, RN 05/11/2015, 10:39 AM

## 2015-05-11 NOTE — Care Management Important Message (Signed)
Important Message  Patient Details  Name: Alan [name] MRN: [id number] Date of Birth: 02/27/1940   Medicare Important Message Given:  Yes    Camillo Flaming 05/11/2015, 11:00 AMImportant Message  Patient Details  Name: Alan [name] MRN: [id number] Date of Birth: 06/22/1940   Medicare Important Message Given:  Yes    Camillo Flaming 05/11/2015, 11:00 AM

## 2015-05-11 NOTE — Progress Notes (Signed)
SATURATION QUALIFICATIONS: (This note is used to comply with regulatory documentation for home oxygen)  Patient Saturations on Room Air at Rest = 86%  Patient Saturations on Room Air while Ambulating = 72-95%  Patient Saturations on 2 Liters of oxygen while Ambulating = 92-100%

## 2015-05-11 NOTE — Progress Notes (Signed)
  Echocardiogram 2D Echocardiogram has been performed.  Jennette Dubin 05/11/2015, 3:19 PM

## 2015-05-11 NOTE — Progress Notes (Addendum)
ANTICOAGULATION CONSULT NOTE - Follow Up Consult  Pharmacy Consult for Lovenox --> Apixaban Indication: pulmonary embolus   Patient Measurements: Height: 5\' 7"  (170.2 cm) Weight: 268 lb 9.6 oz (121.836 kg) IBW/kg (Calculated) : 66.1  Labs:  Recent Labs  05/09/15 0516 05/10/15 0357 05/11/15 0440  HGB  --  11.0* 11.7*  HCT  --  31.9* 34.9*  PLT  --  166 183  CREATININE 0.93 0.73 0.74    Estimated Creatinine Clearance: 98.2 mL/min (by C-G formula based on Cr of 0.74).   Medical History: Past Medical History  Diagnosis Date  . Hypertension   . Allergy   . Arthritis   . Cancer ([place])   . Cataract     Assessment: 45 yoM admitted on 5/1 with hypotension and was being treated for bronchitis.  For unintentional weight loss, he had CT chest and abdomen pelvis.  CT chest not optimized for detection, but suspicious for PE.  Started treatment dose Lovenox.  5/3 Dopplers + RLL DVT.   5/4 CTA chest + b/l PE  Today, 05/11/2015:  SCr back to baseline, AKI following contrast  CBC stable  Goal of Therapy:  Anti-Xa level 0.6-1 units/ml 4hrs after LMWH dose given Monitor platelets by anticoagulation protocol: Yes   Plan:  Continue Lovenox 1 mg/kg (120 mg) q12h Contrast used on CTA 5/4, watch SCr F/U plan for long-term anticoagulation  Hershal Coria, PharmD, BCPS Pager: [phone] 05/11/2015 9:12 AM   ADDENDUM: 05/11/2015 1:47 PM Transitioning to apixaban today.  Last Lovenox dose at 0610 this morning.  Plan: Start apixaban this evening with 10 mg BID x 7 days then on the morning of 5/12, transition to apixaban 5 mg BID. Education provided to patient by pharmacist this morning.  Patient is taking aspirin 81 mg daily.  No compelling indication in this patient to continue aspirin while on apixaban.  Recommend discontinuing aspirin while on apixaban to reduce risk of bleed.  Hershal Coria, PharmD, BCPS Pager: [phone] 05/11/2015 1:50 PM

## 2015-05-11 NOTE — Progress Notes (Signed)
Nutrition Follow-up  DOCUMENTATION CODES:   Morbid obesity  INTERVENTION:  - Continue Heart Healthy diet - Prostat d/c'ed per pt/RN request 5/3 - RD will continue to monitor for additional needs prior to d/c  NUTRITION DIAGNOSIS:   Inadequate oral intake related to acute illness, nausea as evidenced by per patient/family report. -improving the past 2 days  GOAL:   Patient will meet greater than or equal to 90% of their needs -beginning to meet with improved intakes and appetite  MONITOR:   PO intake, Weight trends, Labs, Skin, I & O's  ASSESSMENT:   75 y.o. male with medical history significant of hearing loss, hypertension. Presented with episode of hypotension in the office. He has has some episodes of malaise and occasional lightheaded spells. HE has not been checking blood pressure at home. He has felt weak all over. He have had some cough for the past 24 hours. He was not even aware he had a fever. Patient was seen at PCP office was noted to be febrile and hypotensive 106/51. At baseline his BP 145/70 but he have not checked it for a while. He denies any chest pain, no dyspnea, he has had poor appetite. He has had some nausea.  5/5 No new weight since admission. Per chart review, pt consumed 25% of lunch and 75% of dinner 5/3; 100% of dinner 5/4; 100% of breakfast this AM. RN alerted RD on 5/3 that pt did not like/want to take Prostat and order was subsequently d/c'ed that day. Pt's appetite and intakes are improving the past 2 days. Will continue to monitor intakes and any related nutrition needs prior to d/c.  Pr MD note 5/4 at 1113 hypophosphatemia is likely secondary to diuretics. Pt with acute bronchitis and CT of chest on 05/09/15 was suggestive of PE. Weight loss for pt with hx of melanoma.  Pt beginning to meet needs. Medications reviewed. IVF: NS @ 100 mL/hr. Labs reviewed; Ca: 8.5 mg/dL, Phos: 1.7 mg/dL.     5/2 - No intakes documented since admission.  - Pt reports  that for breakfast he had fruit cup, 1/2 bagel, and apple juice.  - Pt states that for the past 2-3 weeks he has been experiencing nausea and constipation and has had decreased appetite and intakes associated with these symptoms.  - Pt states that appetite is slowly returning with rehydration; he states he was informed that he was severely dehydrated on admission. - Pt reports that he was recently on Prednisone per order from ENT MD.  - He also states that current HTN-related medications were prescribed when he was "larger" and he believes that current dosages are too high for current needs.  - Pt reports that over the past 1.5 years he has intentionally lost 30 lbs and that in the past 3 months he has unintentionally lost an additional 20 lbs (7% body weight) which is not significant for time frame.  - No muscle or fat wasting noted at this time.  - Will continue to monitor weight trends when dehydration is resolved.  - Will order Prostat BID to assist in meeting estimated protein needs.    Diet Order:  Diet Heart Room service appropriate?: Yes; Fluid consistency:: Thin  Skin:  Reviewed, no issues  Last BM:  5/5  Height:   Ht Readings from Last 1 Encounters:  05/07/15 5\' 7"  (1.702 m)    Weight:   Wt Readings from Last 1 Encounters:  05/07/15 268 lb 9.6 oz (121.836 kg)  Ideal Body Weight:  67.27 kg (kg)  BMI:  Body mass index is 42.06 kg/(m^2).  Estimated Nutritional Needs:   Kcal:  1500-1700  Protein:  90-100 grams  Fluid:  >/= 1.7 L/day  EDUCATION NEEDS:   No education needs identified at this time     Jarome Matin, RD, LDN Inpatient Clinical Dietitian Pager # [phone] After hours/weekend pager # [phone]

## 2015-05-11 NOTE — Discharge Instructions (Signed)
Information on my medicine - ELIQUIS (apixaban)  This medication education was reviewed with me or my healthcare representative as part of my discharge preparation.  The pharmacist that spoke with me during my hospital stay was:  Hershal Coria, [facility]  Why was Eliquis prescribed for you? Eliquis was prescribed to treat blood clots that may have been found in the veins of your legs (deep vein thrombosis) or in your lungs (pulmonary embolism) and to reduce the risk of them occurring again.  What do You need to know about Eliquis ? The starting dose is 10 mg (two 5 mg tablets) taken TWICE daily for the FIRST SEVEN (7) DAYS, then on (enter date)  05/18/2015  the dose is reduced to ONE 5 mg tablet taken TWICE daily.  Eliquis may be taken with or without food.   Try to take the dose about the same time in the morning and in the evening. If you have difficulty swallowing the tablet whole please discuss with your pharmacist how to take the medication safely.  Take Eliquis exactly as prescribed and DO NOT stop taking Eliquis without talking to the doctor who prescribed the medication.  Stopping may increase your risk of developing a new blood clot.  Refill your prescription before you run out.  After discharge, you should have regular check-up appointments with your healthcare provider that is prescribing your Eliquis.    What do you do if you miss a dose? If a dose of ELIQUIS is not taken at the scheduled time, take it as soon as possible on the same day and twice-daily administration should be resumed. The dose should not be doubled to make up for a missed dose.  Important Safety Information A possible side effect of Eliquis is bleeding. You should call your healthcare provider right away if you experience any of the following: ? Bleeding from an injury or your nose that does not stop. ? Unusual colored urine (red or dark brown) or unusual colored stools (red or black). ? Unusual bruising for  unknown reasons. ? A serious fall or if you hit your head (even if there is no bleeding).  Some medicines may interact with Eliquis and might increase your risk of bleeding or clotting while on Eliquis. To help avoid this, consult your healthcare provider or pharmacist prior to using any new prescription or non-prescription medications, including herbals, vitamins, non-steroidal anti-inflammatory drugs (NSAIDs) and supplements.  This website has more information on Eliquis (apixaban): http://[email or web address].com/eliquis/home

## 2015-05-11 NOTE — Progress Notes (Signed)
PROGRESS NOTE    Alan [name]  [id number] DOB: 01/20/1940 DOA: 05/07/2015 PCP: Criselda Peaches, MD  Outpatient Specialists: Dr. Constance Holster, ENT  Brief Narrative: 75 year old male with uncomplicated past medical history, direct admission for relative hypotension.  Assessment & Plan   Hypotension -Likely secondary to dehydration. -BP responding to IV fluids -Orthostatic vitals unremarkable  Bilateral Pulmonary emboli/RLE DVT -CT chest with contrast obtained on 05/09/2015 which was suggestive of PE however not absolute -CTA chest on 05/10/2015: Bilateral pulmonary emboli with a single pulmonary embolus to segmental branch to the right lower lobe, several pulmonary emboli on the left. -Lower extremity Doppler: DVT involving right popliteal and peroneal veins -Patient was placed on full dose Lovenox on 05/09/2015, will transition to oral AC -Obtain echocardiogram  Fever -Patient has remained afebrile for more than 24 hours -Blood cultures show no growth to date -?related to DVT or PE -Currently on doxycycline -Obtain CT chest, abdomen and pelvis which did show some mild bilateral lower lobe atelectasis, trace below pleural effusions, right renal stone. -Chest x-ray UA unremarkable for infection -urine culture: multiple species  Acute bronchitis/?pneumonia -Appears to be improving. Continue bronchodilators, antibiotics -Chest x-ray remarkable for infection, suggestive of airway disease/bronchitis -Patient does have mild cough, will order antitussives -CT chest: Mild bilateral lower lobe atelectasis, trace bilateral pleural effusions. Enlarged pulmonary arteries indicative of pulmonary arterial hypertension -CTA chest: No opacity in the anterior medial left lower lobe  Hypokalemia/hypophosphatemia -Likely secondary to diuretics -Replacing, continue to monitor  Isolated hyperbilirubinemia -Appears to be improving, no complaints of abdominal pain -Likely secondary to  dehydration -Abdominal x-ray unremarkable  Idiopathic sudden hearing loss -Patient follows with Dr. Constance Holster, ENT -Was treated with steroids (prednisone) for extended period of time  Constipation -was given suppository, and was able to have bowel movement  Unintentional weight loss -Given the patient does have history of melanoma -CT chest, abdomen, pelvis with contrast showed questionable PE, renal stone  Generalized weakness -PT consulted, recommended home health  DVT Prophylaxis  Lovenox  Code Status: Full  Family Communication: None at bedside.   Disposition Plan: Admitted. Will transition to oral anticoagulation today, obtain echocardiogram.  Consultants None  Procedures  None  Antibiotics   Anti-infectives    Start     Dose/Rate Route Frequency Ordered Stop   05/08/15 0145  doxycycline (VIBRA-TABS) tablet 100 mg     100 mg Oral Every 12 hours 05/08/15 0140        Subjective:   Alan [name] seen and examined today.  Feels he is improving slowly.  Denies chest pain, shortness of breath, abdominal pain, nausea or vomiting.   Objective:   Filed Vitals:   05/10/15 0525 05/10/15 2033 05/10/15 2238 05/11/15 0450  BP: 123/71 123/65  119/62  Pulse: 84 79  77  Temp: 98.1 F (36.7 C) 99.9 F (37.7 C) 98.6 F (37 C) 98.3 F (36.8 C)  TempSrc: Oral Oral Oral Oral  Resp: 16 18  20   Height:      Weight:      SpO2: 92% 96%  96%    Intake/Output Summary (Last 24 hours) at 05/11/15 1122 Last data filed at 05/11/15 0830  Gross per 24 hour  Intake   2688 ml  Output      0 ml  Net   2688 ml   Filed Weights   05/07/15 1839  Weight: 121.836 kg (268 lb 9.6 oz)    Exam  General: Well developed, well nourished, NAD  HEENT: NCAT, mucous  membranes moist.   Cardiovascular: S1 S2 auscultated, no murmurs, RRR  Respiratory: Diminished but clear, no wheezing or rhonchi. Dry cough  Abdomen: Soft, obese, nontender, nondistended, + bowel sounds  Extremities: warm  dry without cyanosis clubbing or edema  Neuro: AAOx3, nonfocal  Psych: Normal affect and demeanor, pleasant  Data Reviewed: I have personally reviewed following labs and imaging studies  CBC:  Recent Labs Lab 05/07/15 2116 05/08/15 0508 05/10/15 0357 05/11/15 0440  WBC 7.1 8.1 5.3 4.9  NEUTROABS 4.9  --   --   --   HGB 13.3 13.8 11.0* 11.7*  HCT 38.8* 39.9 31.9* 34.9*  MCV 90.4 90.7 89.6 90.9  PLT 164 160 166 [id number]   Basic Metabolic Panel:  Recent Labs Lab 05/07/15 2116 05/08/15 0508 05/09/15 0516 05/10/15 0357 05/11/15 0440  NA 138 139 142 142 144  K 2.8* 3.3* 3.2* 3.0* 3.6  CL 100* 102 105 106 109  CO2 27 25 27 28 25   GLUCOSE 128* 113* 127* 132* 104*  BUN 35* 34* 24* 13 13  CREATININE 1.38* 1.27* 0.93 0.73 0.74  CALCIUM 9.3 9.3 8.8* 8.4* 8.5*  MG  --  1.8  1.6*  --   --   --   PHOS  --  1.8*  1.8*  --  1.5* 1.7*   GFR: Estimated Creatinine Clearance: 98.2 mL/min (by C-G formula based on Cr of 0.74). Liver Function Tests:  Recent Labs Lab 05/07/15 2116 05/08/15 0508 05/09/15 0516 05/10/15 0357 05/11/15 0440  AST 25 27 25 26 25   ALT 26 24 25 26 27   ALKPHOS 49 50 50 52 49  BILITOT 2.0* 2.0* 1.1 1.0 0.7  PROT 6.0* 5.9* 5.4* 5.0* 5.0*  ALBUMIN 3.2* 3.0* 2.7* 2.3* 2.3*   No results for input(s): LIPASE, AMYLASE in the last 168 hours. No results for input(s): AMMONIA in the last 168 hours. Coagulation Profile:  Recent Labs Lab 05/08/15 0508  INR 1.04   Cardiac Enzymes: No results for input(s): CKTOTAL, CKMB, CKMBINDEX, TROPONINI in the last 168 hours. BNP (last 3 results) No results for input(s): PROBNP in the last 8760 hours. HbA1C: No results for input(s): HGBA1C in the last 72 hours. CBG: No results for input(s): GLUCAP in the last 168 hours. Lipid Profile: No results for input(s): CHOL, HDL, LDLCALC, TRIG, CHOLHDL, LDLDIRECT in the last 72 hours. Thyroid Function Tests: No results for input(s): TSH, T4TOTAL, FREET4, T3FREE, THYROIDAB in  the last 72 hours. Anemia Panel: No results for input(s): VITAMINB12, FOLATE, FERRITIN, TIBC, IRON, RETICCTPCT in the last 72 hours. Urine analysis:    Component Value Date/Time   COLORURINE AMBER* 05/07/2015 2223   APPEARANCEUR CLOUDY* 05/07/2015 2223   LABSPEC 1.024 05/07/2015 2223   PHURINE 5.5 05/07/2015 2223   GLUCOSEU NEGATIVE 05/07/2015 2223   HGBUR NEGATIVE 05/07/2015 2223   BILIRUBINUR SMALL* 05/07/2015 2223   KETONESUR 15* 05/07/2015 2223   PROTEINUR 30* 05/07/2015 2223   NITRITE NEGATIVE 05/07/2015 2223   LEUKOCYTESUR TRACE* 05/07/2015 2223   Sepsis Labs: @LABRCNTIP (procalcitonin:4,lacticidven:4)  ) Recent Results (from the past 240 hour(s))  Urine culture     Status: Abnormal   Collection Time: 05/07/15 10:23 PM  Result Value Ref Range Status   Specimen Description URINE, RANDOM  Final   Special Requests NONE  Final   Culture MULTIPLE SPECIES PRESENT, SUGGEST RECOLLECTION (A)  Final   Report Status 05/09/2015 FINAL  Final  Respiratory virus panel     Status: None   Collection Time: 05/08/15  3:11  AM  Result Value Ref Range Status   Respiratory Syncytial Virus A Negative Negative Final   Respiratory Syncytial Virus B Negative Negative Final   Influenza A Negative Negative Final   Influenza B Negative Negative Final   Parainfluenza 1 Negative Negative Final   Parainfluenza 2 Negative Negative Final   Parainfluenza 3 Negative Negative Final   Metapneumovirus Negative Negative Final   Rhinovirus Negative Negative Final   Adenovirus Negative Negative Final    Comment: (NOTE) Performed At: [facility] [place], [place] [id number] Lindon Romp MD [id number]   Culture, blood (routine x 2)     Status: None (Preliminary result)   Collection Time: 05/09/15  6:30 AM  Result Value Ref Range Status   Specimen Description BLOOD RIGHT HAND  Final   Special Requests BOTTLES DRAWN AEROBIC ONLY [place]  Final   Culture   Final    NO GROWTH 1  DAY Performed at [facility]    Report Status PENDING  Incomplete  Culture, blood (routine x 2)     Status: None (Preliminary result)   Collection Time: 05/09/15  6:35 AM  Result Value Ref Range Status   Specimen Description BLOOD LEFT ARM  Final   Special Requests BOTTLES DRAWN AEROBIC AND ANAEROBIC 10CC  Final   Culture   Final    NO GROWTH 1 DAY Performed at [facility]    Report Status PENDING  Incomplete      Radiology Studies: Ct Chest W Contrast  05/09/2015  CLINICAL DATA:  Hypotension, malaise, lightheadedness, cough, fever, weight loss. EXAM: CT CHEST, ABDOMEN, AND PELVIS WITH CONTRAST TECHNIQUE: Multidetector CT imaging of the chest, abdomen and pelvis was performed following the standard protocol during bolus administration of intravenous contrast. CONTRAST:  157mL ISOVUE-300 IOPAMIDOL (ISOVUE-300) INJECTION 61% COMPARISON:  None. FINDINGS: CT CHEST FINDINGS Mediastinum/Lymph Nodes: No pathologically enlarged mediastinal, hilar or axillary lymph nodes. Although this study is not optimized for the detection of pulmonary emboli, there is suspected intraluminal low-attenuation at the bifurcation of the left upper and left lower lobe pulmonary arteries, extending into the lobar and segmental pulmonary arteries. Pulmonary arteries are enlarged. Heart size is at the upper limits of normal. No pericardial effusion. Tiny hiatal hernia. Lungs/Pleura: Image quality is degraded by respiratory motion and expiratory phase imaging. Probable atelectasis in the lower lobes. Trace bilateral pleural effusions. Airway is unremarkable. Musculoskeletal: No worrisome lytic or sclerotic lesions. CT ABDOMEN PELVIS FINDINGS Hepatobiliary: Liver and gallbladder are unremarkable. No biliary ductal dilatation. Pancreas: Negative. Spleen: Negative. Adrenals/Urinary Tract: Adrenal glands are unremarkable. Low-attenuation lesions in the kidneys measure up to 2.6 cm on the left and are likely cysts,  statistically. Punctate stone in the right kidney. Ureters are decompressed. Bladder is low in volume. Stomach/Bowel: Small hiatal hernia. Stomach, small bowel and colon are unremarkable. Appendix is not readily visualized. Vascular/Lymphatic: Atherosclerotic calcification of the arterial vasculature without abdominal aortic aneurysm. No pathologically enlarged lymph nodes. Reproductive: Prostate is normal in size. Other: No free fluid.  Mesenteries and peritoneum are unremarkable. Musculoskeletal: No worrisome lytic or sclerotic lesions. Grade 1 anterolisthesis of L4 on L5. IMPRESSION: 1. Although this study is not optimized for the detection of pulmonary emboli, there is low-attenuation in the left pulmonary arterial tree, suspicious for pulmonary emboli. Critical Value/emergent results were called by telephone at the time of interpretation on 05/09/2015 at 2:43 pm to Dr. Cristal Ford , who verbally acknowledged these results. 2. Mild bilateral lower lobe atelectasis and trace  bilateral pleural effusions. 3. Enlarged pulmonary arteries, indicative of pulmonary arterial hypertension. 4. Punctate right renal stone. Electronically Signed   By: Lorin Picket M.D.   On: 05/09/2015 14:46   Ct Angio Chest Pe W/cm &/or Wo Cm  05/10/2015  CLINICAL DATA:  Short of breath for 5 days. Right popliteal vein deep venous thrombosis. EXAM: CT ANGIOGRAPHY CHEST WITH CONTRAST TECHNIQUE: Multidetector CT imaging of the chest was performed using the standard protocol during bolus administration of intravenous contrast. Multiplanar CT image reconstructions and MIPs were obtained to evaluate the vascular anatomy. CONTRAST:  100 mL of Isovue 370 intravenous contrast COMPARISON:  CT, 05/09/2015 FINDINGS: Angiographic study: There are bilateral pulmonary emboli. Emboli are primarily on the left. An embolus straddles the left upper and lower lobe pulmonary arteries. Emboli extend into the left lower lobe segmental branches. An embolus  extends into the anterior segmental branch to the left upper lobe. Emboli are primarily nonocclusive. There is a single embolus to the posterior basilar segmental branch of the right lower lobe pulmonary artery. No other convincing right-sided pulmonary emboli. These emboli are without change from the CT dated 05/09/2015. No convincing new pulmonary emboli. Main pulmonary artery is dilated to 4.1 cm. Right pulmonary artery measures 3.1 cm. Left is normal in caliber. The RV to LV ratio is 1. Aorta is normal in caliber.  No significant atherosclerotic plaque. Neck base and axilla:  No mass or adenopathy. Mediastinum and hila: Heart is normal in size and configuration. Mild coronary artery calcifications. No mediastinal or hilar masses or pathologically enlarged lymph nodes. Lungs and pleural: Minimal pleural effusions. Focal opacity has developed in the anteromedial left upper lobe, primarily ground-glass opacity. This may reflect an area of infarction, or pneumonia or atelectasis. Dependent opacity mostly in the lower lobes is consistent with atelectasis, similar in the lower lobes since prior CT but increased in the posterior upper lobes. Pleural effusions are mildly increased from the prior CT. No pulmonary edema. No pneumothorax. Limited upper abdomen:  Small hiatal hernia.  No acute findings. Musculoskeletal: Bones are demineralized. There are degenerative changes along the mid and lower thoracic spine. Review of the MIP images confirms the above findings. IMPRESSION: 1. Bilateral pulmonary emboli, with a single pulmonary embolus to a segmental branch to the right lower lobe, and several pulmonary emboli on the left. These are stable from the prior CT of the chest, abdomen and pelvis. No convincing new pulmonary emboli. 2. New opacity in the anteromedial left upper lobe, distal to the embolus in a left upper lobe segmental pulmonary artery branch. This may reflect infarction. Alternatively it may reflect  infection or atelectasis. 3. Mild increase in dependent atelectasis in the upper lobes. Similar lower lobe dependent atelectasis. Minimal pleural effusions, mildly increased in size the prior study. No other change. Electronically Signed   By: Lajean Manes M.D.   On: 05/10/2015 14:58   Ct Abdomen Pelvis W Contrast  05/09/2015  CLINICAL DATA:  Hypotension, malaise, lightheadedness, cough, fever, weight loss. EXAM: CT CHEST, ABDOMEN, AND PELVIS WITH CONTRAST TECHNIQUE: Multidetector CT imaging of the chest, abdomen and pelvis was performed following the standard protocol during bolus administration of intravenous contrast. CONTRAST:  155mL ISOVUE-300 IOPAMIDOL (ISOVUE-300) INJECTION 61% COMPARISON:  None. FINDINGS: CT CHEST FINDINGS Mediastinum/Lymph Nodes: No pathologically enlarged mediastinal, hilar or axillary lymph nodes. Although this study is not optimized for the detection of pulmonary emboli, there is suspected intraluminal low-attenuation at the bifurcation of the left upper and left lower lobe pulmonary  arteries, extending into the lobar and segmental pulmonary arteries. Pulmonary arteries are enlarged. Heart size is at the upper limits of normal. No pericardial effusion. Tiny hiatal hernia. Lungs/Pleura: Image quality is degraded by respiratory motion and expiratory phase imaging. Probable atelectasis in the lower lobes. Trace bilateral pleural effusions. Airway is unremarkable. Musculoskeletal: No worrisome lytic or sclerotic lesions. CT ABDOMEN PELVIS FINDINGS Hepatobiliary: Liver and gallbladder are unremarkable. No biliary ductal dilatation. Pancreas: Negative. Spleen: Negative. Adrenals/Urinary Tract: Adrenal glands are unremarkable. Low-attenuation lesions in the kidneys measure up to 2.6 cm on the left and are likely cysts, statistically. Punctate stone in the right kidney. Ureters are decompressed. Bladder is low in volume. Stomach/Bowel: Small hiatal hernia. Stomach, small bowel and colon are  unremarkable. Appendix is not readily visualized. Vascular/Lymphatic: Atherosclerotic calcification of the arterial vasculature without abdominal aortic aneurysm. No pathologically enlarged lymph nodes. Reproductive: Prostate is normal in size. Other: No free fluid.  Mesenteries and peritoneum are unremarkable. Musculoskeletal: No worrisome lytic or sclerotic lesions. Grade 1 anterolisthesis of L4 on L5. IMPRESSION: 1. Although this study is not optimized for the detection of pulmonary emboli, there is low-attenuation in the left pulmonary arterial tree, suspicious for pulmonary emboli. Critical Value/emergent results were called by telephone at the time of interpretation on 05/09/2015 at 2:43 pm to Dr. Cristal Ford , who verbally acknowledged these results. 2. Mild bilateral lower lobe atelectasis and trace bilateral pleural effusions. 3. Enlarged pulmonary arteries, indicative of pulmonary arterial hypertension. 4. Punctate right renal stone. Electronically Signed   By: Lorin Picket M.D.   On: 05/09/2015 14:46     Scheduled Meds: . aspirin  81 mg Oral Daily  . benzonatate  200 mg Oral BID  . doxycycline  100 mg Oral Q12H  . enoxaparin (LOVENOX) injection  120 mg Subcutaneous Q12H  . pantoprazole  40 mg Oral Daily  . polyethylene glycol  17 g Oral BID  . senna  1 tablet Oral QHS  . sodium chloride flush  3 mL Intravenous Q12H   Continuous Infusions: . sodium chloride 100 mL/hr at 05/11/15 0732     LOS: 4 days   Time Spent in minutes   30 minutes  Verlisa Vara D.O. on 05/11/2015 at 11:22 AM  Between 7am to 7pm - Pager - [phone]  After 7pm go to [email or web address] - password [email or web address]  And look for the night coverage person covering for me after hours  [facility] Office  [phone]

## 2015-05-12 LAB — CBC
HCT: 31.5 % — ABNORMAL LOW (ref 39.0–52.0)
Hemoglobin: 10.6 g/dL — ABNORMAL LOW (ref 13.0–17.0)
MCH: 31.4 pg (ref 26.0–34.0)
MCHC: 33.7 g/dL (ref 30.0–36.0)
MCV: 93.2 fL (ref 78.0–100.0)
PLATELETS: 217 10*3/uL (ref 150–400)
RBC: 3.38 MIL/uL — ABNORMAL LOW (ref 4.22–5.81)
RDW: 14.1 % (ref 11.5–15.5)
WBC: 4 10*3/uL (ref 4.0–10.5)

## 2015-05-12 LAB — BASIC METABOLIC PANEL
Anion gap: 7 (ref 5–15)
BUN: 13 mg/dL (ref 6–20)
CO2: 26 mmol/L (ref 22–32)
CREATININE: 0.72 mg/dL (ref 0.61–1.24)
Calcium: 8.3 mg/dL — ABNORMAL LOW (ref 8.9–10.3)
Chloride: 110 mmol/L (ref 101–111)
GLUCOSE: 107 mg/dL — AB (ref 65–99)
Potassium: 3.6 mmol/L (ref 3.5–5.1)
SODIUM: 143 mmol/L (ref 135–145)

## 2015-05-12 MED ORDER — APIXABAN 5 MG PO TABS: 10.0000 mg | ORAL_TABLET | Freq: Two times a day (BID) | ORAL | Status: AC

## 2015-05-12 MED ORDER — BENZONATATE 200 MG PO CAPS: 200.0000 mg | ORAL_CAPSULE | Freq: Two times a day (BID) | ORAL | Status: DC

## 2015-05-12 MED ORDER — APIXABAN 5 MG PO TABS: 5.0000 mg | ORAL_TABLET | Freq: Two times a day (BID) | ORAL | Status: DC

## 2015-05-12 MED ORDER — DOXYCYCLINE HYCLATE 100 MG PO TABS: 100.0000 mg | ORAL_TABLET | Freq: Two times a day (BID) | ORAL | Status: DC

## 2015-05-12 NOTE — Progress Notes (Signed)
SATURATION QUALIFICATIONS: (This note is used to comply with regulatory documentation for home oxygen)  Patient Saturations on Room Air at Rest = 86%  Patient Saturations on Room Air while Ambulating = 72%  Patient Saturations on 2 Liters of oxygen while Ambulating = 92%  Please briefly explain why patient needs home oxygen: patient needs oxygen to ensure O2 saturation above 90% while walking and at rest.   Barbee Shropshire. Brigitte Pulse, RN

## 2015-05-12 NOTE — Care Management Note (Addendum)
Case Management Note  Patient Details  Name: Alan [name] MRN: [id number] Date of Birth: 1940-10-22  Subjective/Objective:   Bilateral pulmonary emboli/right lower extremity DVT                 Action/Plan: Discharge Planning: AVS reviewed:  Chart reviewed. Please see previous NCM notes on [place] set up. [facility] Liaison for oxygen and [facility] PT for home. NCM spoke to pt at bedside, gave permission to speak to Oralia Rud # [phone]. Pt states he will contact [facility] to arrange an appointment. Contacted dtr, Estill Bamberg to follow up on Eliquis 30 day free trial card. Spoke to Unit RN and she will speak to dtr when she comes to pick pt up. Pt states no other DME needed for home.   PCP- Levin Erp MD   1230 Pt has Eliquis 30 day free trial card. Contacted [facility] and they do have in stock. Faxed pt's insurance information.    Expected Discharge Date:  05/12/2015              Expected Discharge Plan:  [place]  In-House Referral:  NA  Discharge planning Services  CM Consult  Post Acute Care Choice:  Home Health Choice offered to:  Patient  DME Arranged:  Oxygen DME Agency:  [place]:  PT [facility] Agency:  [place]  Status of Service:  Completed, signed off  Medicare Important Message Given:  Yes Date Medicare IM Given:    Medicare IM give by:    Date Additional Medicare IM Given:    Additional Medicare Important Message give by:     If discussed at [place] of Stay Meetings, dates discussed:    Additional Comments:  Erenest Rasher, RN 05/12/2015, 11:26 AM

## 2015-05-12 NOTE — Discharge Summary (Addendum)
Physician Discharge Summary  Alan [name] [id number] DOB: 1940/11/21 DOA: 05/07/2015  PCP: Criselda Peaches, MD  Admit date: 05/07/2015 Discharge date: 05/12/2015  Time spent: 45 minutes  Recommendations for Outpatient Follow-up:  Patient will be discharged to Home with home health PT and oxygen.  Patient will need to follow up with primary care provider within one week of discharge.  Discussed follow up with pulmonology, please call and schedule an appointment. Patient should continue medications as prescribed.  Patient should follow a heart healthy diet.   Discharge Diagnoses:  Hypotension Bilateral pulmonary emboli/right lower extremity DVT Fever Acute bronchitis/questionable pneumonia Hypokalemia/hypophosphatemia Isolated hyperbilirubinemia Idiopathic sudden hearing loss Constipation Weight loss Generalized weakness  Discharge Condition: Stable  Diet recommendation: Heart healthy  Filed Weights   05/07/15 1839  Weight: 121.836 kg (268 lb 9.6 oz)    History of present illness:  On 05/08/2015 by Dr. Irish Lack is a 75 y.o. male with medical history significant of hearing loss, hypertension Presented with episode of hypotension in the office. He has has some episodes of malaise and occasional lightheaded spells. HE has not been checking blood pressure at home. He has felt weak all over.  He have had some cough for the past 24 hours. He was not even aware he had a fever. Patient was seen at PCP office was noted to be febrile and hypotensive 106/51. At baseline his BP 145/70 but he have not checked it for a while.  He denies any chest pain, no dyspnea, he have had poor appetite. He have had some nausea.   Hospital Course:  Hypotension -Resolved, Likely secondary to dehydration. -BP responded to IV fluids -Orthostatic vitals unremarkable  Bilateral Pulmonary emboli/RLE DVT -CT chest with contrast obtained on 05/09/2015 which was suggestive of PE  however not absolute -CTA chest on 05/10/2015: Bilateral pulmonary emboli with a single pulmonary embolus to segmental branch to the right lower lobe, several pulmonary emboli on the left. -Lower extremity Doppler: DVT involving right popliteal and peroneal veins -Patient was placed on full dose Lovenox on 05/09/2015, will transition to oral AC -Echocardiogram: EF 0000000, diastolic function parameters normal.  PA peak pressure 23mmHG, Right ventricular systolic pressure increased-consistent with moderate pulmonary hypertension -Discussed follow up with pulmonary medicine.  Patient will need anticoagulation workup and sleep study.  -Was initially placed on full dose lovenox, transitioned to Eliquis.  Called for prior auth.  -case management consulted for medication coverage -Upon ambulation, patient's O2 sats dropped to 72%.  Will discharge with oxygen therapy.   Fever -Patient has remained afebrile for more than 24 hours -Blood cultures show no growth to date -?related to DVT or PE -Currently on doxycycline -Obtain CT chest, abdomen and pelvis which did show some mild bilateral lower lobe atelectasis, trace below pleural effusions, right renal stone. -Chest x-ray UA unremarkable for infection -urine culture: multiple species  Acute bronchitis/?pneumonia -Appears to be improving. Continue bronchodilators, antibiotics -Chest x-ray remarkable for infection, suggestive of airway disease/bronchitis -Patient does have mild cough, continue antitussives -CT chest: Mild bilateral lower lobe atelectasis, trace bilateral pleural effusions. Enlarged pulmonary arteries indicative of pulmonary arterial hypertension -CTA chest: No opacity in the anterior medial left lower lobe  Hypokalemia/hypophosphatemia -Likely secondary to diuretics -Replacing, continue to monitor  Isolated hyperbilirubinemia -Appears to be improving, no complaints of abdominal pain -Likely secondary to dehydration -Abdominal  x-ray unremarkable  Idiopathic sudden hearing loss -Patient follows with Dr. Constance Holster, ENT -Was treated with steroids (prednisone) for extended period of  time  Constipation -was given suppository, and was able to have bowel movement  Unintentional weight loss -Given the patient does have history of melanoma -CT chest, abdomen, pelvis with contrast showed questionable PE, renal stone  Generalized weakness -PT consulted, recommended home health  Morbid obesity -Nutrition consulted -BMI 42 -Patient will need to follow up with his PCP for lifestyle modifications including diet and exercise.  Consultants None  Procedures  Echocardiogram Lower ext doppler  Discharge Exam: Filed Vitals:   05/11/15 2148 05/12/15 0520  BP: 145/60 114/44  Pulse: 95 85  Temp: 98.6 F (37 C) 98.7 F (37.1 C)  Resp: 20 20    Exam  General: Well developed, well nourished, NAD  HEENT: NCAT, mucous membranes moist.   Cardiovascular: S1 S2 auscultated, no murmurs, RRR  Respiratory: Diminished but clear, no wheezing or rhonchi. Dry cough  Abdomen: Soft, obese, nontender, nondistended, + bowel sounds  Extremities: warm dry without cyanosis clubbing or edema  Neuro: AAOx3, nonfocal  Psych: Normal affect and demeanor, pleasant  Discharge Instructions      Discharge Instructions    Discharge instructions    Complete by:  As directed   Patient will be discharged to Home with home health PT and oxygen.  Patient will need to follow up with primary care provider within one week of discharge.  Discussed follow up with pulmonology, please call and schedule an appointment. Patient should continue medications as prescribed.  Patient should follow a heart healthy diet.   You will need anticoagulation workup in a few weeks. Follow up with pulmonology to discuss pulmonary embolism, possible sleep apnea, and pulmonary hypertension.  Discuss hypertension management with your primary care physician.             Medication List    STOP taking these medications        aspirin 81 MG chewable tablet     aspirin-acetaminophen-caffeine 250-250-65 MG tablet  Commonly known as:  EXCEDRIN MIGRAINE     atenolol 50 MG tablet  Commonly known as:  TENORMIN     hydrochlorothiazide 25 MG tablet  Commonly known as:  HYDRODIURIL     ibuprofen 200 MG tablet  Commonly known as:  ADVIL,MOTRIN      TAKE these medications        apixaban 5 MG Tabs tablet  Commonly known as:  ELIQUIS  Take 2 tablets (10 mg total) by mouth 2 (two) times daily.     apixaban 5 MG Tabs tablet  Commonly known as:  ELIQUIS  Take 1 tablet (5 mg total) by mouth 2 (two) times daily.  Start taking on:  05/19/2015     benzonatate 200 MG capsule  Commonly known as:  TESSALON  Take 1 capsule (200 mg total) by mouth 2 (two) times daily.     beta carotene w/minerals tablet  Take 1 tablet by mouth daily.     doxycycline 100 MG tablet  Commonly known as:  VIBRA-TABS  Take 1 tablet (100 mg total) by mouth every 12 (twelve) hours.     losartan 100 MG tablet  Commonly known as:  COZAAR  Take 100 mg by mouth daily.     omeprazole 20 MG capsule  Commonly known as:  PRILOSEC  Take 20 mg by mouth daily.       Allergies  Allergen Reactions  . Shellfish Allergy Nausea And Vomiting   Follow-up Information    Follow up with GREEN, Keenan Bachelor, MD. Schedule an appointment as soon as  possible for a visit in 1 week.   Specialty:  Internal Medicine   Why:  Hospital follow up   Contact information:   [address] [place] [address] [phone]       Schedule an appointment as soon as possible for a visit with [facility].   Specialty:  Pulmonology   Why:  Pulmonary embolism, pulmonary hypertension   Contact information:   [place] [phone]       The results of significant diagnostics from this hospitalization (including imaging,  microbiology, ancillary and laboratory) are listed below for reference.    Significant Diagnostic Studies: X-ray Chest Pa And Lateral  05/07/2015  CLINICAL DATA:  Cough and fever for the past 2 days. History of hypertension. EXAM: CHEST  2 VIEW COMPARISON:  None. FINDINGS: Normal cardiac silhouette and mediastinal contours given slightly kyphotic projection. Atherosclerotic plaque within the thoracic aorta. Mild diffuse slightly nodular thickening of the pulmonary interstitium. No focal airspace opacities. No pleural effusion or pneumothorax. No evidence of edema. No acute osseus abnormalities. Old left seventh rib fracture. IMPRESSION: Findings suggestive of airways disease / bronchitis. No focal airspace opacities to suggest pneumonia. Electronically Signed   By: Sandi Mariscal M.D.   On: 05/07/2015 23:13   Dg Abd 1 View  05/08/2015  CLINICAL DATA:  Nausea.  Bloating and constipation. EXAM: ABDOMEN - 1 VIEW COMPARISON:  CT of 12/17/2010 FINDINGS: Two supine views. Non-obstructive bowel gas pattern. Distal stool and gas. No abnormal abdominal calcifications. No appendicolith. Phleboliths in the pelvis. IMPRESSION: No acute findings. Electronically Signed   By: Abigail Miyamoto M.D.   On: 05/08/2015 10:38   Ct Chest W Contrast  05/09/2015  CLINICAL DATA:  Hypotension, malaise, lightheadedness, cough, fever, weight loss. EXAM: CT CHEST, ABDOMEN, AND PELVIS WITH CONTRAST TECHNIQUE: Multidetector CT imaging of the chest, abdomen and pelvis was performed following the standard protocol during bolus administration of intravenous contrast. CONTRAST:  126mL ISOVUE-300 IOPAMIDOL (ISOVUE-300) INJECTION 61% COMPARISON:  None. FINDINGS: CT CHEST FINDINGS Mediastinum/Lymph Nodes: No pathologically enlarged mediastinal, hilar or axillary lymph nodes. Although this study is not optimized for the detection of pulmonary emboli, there is suspected intraluminal low-attenuation at the bifurcation of the left upper and left lower lobe  pulmonary arteries, extending into the lobar and segmental pulmonary arteries. Pulmonary arteries are enlarged. Heart size is at the upper limits of normal. No pericardial effusion. Tiny hiatal hernia. Lungs/Pleura: Image quality is degraded by respiratory motion and expiratory phase imaging. Probable atelectasis in the lower lobes. Trace bilateral pleural effusions. Airway is unremarkable. Musculoskeletal: No worrisome lytic or sclerotic lesions. CT ABDOMEN PELVIS FINDINGS Hepatobiliary: Liver and gallbladder are unremarkable. No biliary ductal dilatation. Pancreas: Negative. Spleen: Negative. Adrenals/Urinary Tract: Adrenal glands are unremarkable. Low-attenuation lesions in the kidneys measure up to 2.6 cm on the left and are likely cysts, statistically. Punctate stone in the right kidney. Ureters are decompressed. Bladder is low in volume. Stomach/Bowel: Small hiatal hernia. Stomach, small bowel and colon are unremarkable. Appendix is not readily visualized. Vascular/Lymphatic: Atherosclerotic calcification of the arterial vasculature without abdominal aortic aneurysm. No pathologically enlarged lymph nodes. Reproductive: Prostate is normal in size. Other: No free fluid.  Mesenteries and peritoneum are unremarkable. Musculoskeletal: No worrisome lytic or sclerotic lesions. Grade 1 anterolisthesis of L4 on L5. IMPRESSION: 1. Although this study is not optimized for the detection of pulmonary emboli, there is low-attenuation in the left pulmonary arterial tree, suspicious for pulmonary emboli.  Critical Value/emergent results were called by telephone at the time of interpretation on 05/09/2015 at 2:43 pm to Dr. Cristal Ford , who verbally acknowledged these results. 2. Mild bilateral lower lobe atelectasis and trace bilateral pleural effusions. 3. Enlarged pulmonary arteries, indicative of pulmonary arterial hypertension. 4. Punctate right renal stone. Electronically Signed   By: Lorin Picket M.D.   On:  05/09/2015 14:46   Ct Angio Chest Pe W/cm &/or Wo Cm  05/10/2015  CLINICAL DATA:  Short of breath for 5 days. Right popliteal vein deep venous thrombosis. EXAM: CT ANGIOGRAPHY CHEST WITH CONTRAST TECHNIQUE: Multidetector CT imaging of the chest was performed using the standard protocol during bolus administration of intravenous contrast. Multiplanar CT image reconstructions and MIPs were obtained to evaluate the vascular anatomy. CONTRAST:  100 mL of Isovue 370 intravenous contrast COMPARISON:  CT, 05/09/2015 FINDINGS: Angiographic study: There are bilateral pulmonary emboli. Emboli are primarily on the left. An embolus straddles the left upper and lower lobe pulmonary arteries. Emboli extend into the left lower lobe segmental branches. An embolus extends into the anterior segmental branch to the left upper lobe. Emboli are primarily nonocclusive. There is a single embolus to the posterior basilar segmental branch of the right lower lobe pulmonary artery. No other convincing right-sided pulmonary emboli. These emboli are without change from the CT dated 05/09/2015. No convincing new pulmonary emboli. Main pulmonary artery is dilated to 4.1 cm. Right pulmonary artery measures 3.1 cm. Left is normal in caliber. The RV to LV ratio is 1. Aorta is normal in caliber.  No significant atherosclerotic plaque. Neck base and axilla:  No mass or adenopathy. Mediastinum and hila: Heart is normal in size and configuration. Mild coronary artery calcifications. No mediastinal or hilar masses or pathologically enlarged lymph nodes. Lungs and pleural: Minimal pleural effusions. Focal opacity has developed in the anteromedial left upper lobe, primarily ground-glass opacity. This may reflect an area of infarction, or pneumonia or atelectasis. Dependent opacity mostly in the lower lobes is consistent with atelectasis, similar in the lower lobes since prior CT but increased in the posterior upper lobes. Pleural effusions are mildly  increased from the prior CT. No pulmonary edema. No pneumothorax. Limited upper abdomen:  Small hiatal hernia.  No acute findings. Musculoskeletal: Bones are demineralized. There are degenerative changes along the mid and lower thoracic spine. Review of the MIP images confirms the above findings. IMPRESSION: 1. Bilateral pulmonary emboli, with a single pulmonary embolus to a segmental branch to the right lower lobe, and several pulmonary emboli on the left. These are stable from the prior CT of the chest, abdomen and pelvis. No convincing new pulmonary emboli. 2. New opacity in the anteromedial left upper lobe, distal to the embolus in a left upper lobe segmental pulmonary artery branch. This may reflect infarction. Alternatively it may reflect infection or atelectasis. 3. Mild increase in dependent atelectasis in the upper lobes. Similar lower lobe dependent atelectasis. Minimal pleural effusions, mildly increased in size the prior study. No other change. Electronically Signed   By: Lajean Manes M.D.   On: 05/10/2015 14:58   Ct Abdomen Pelvis W Contrast  05/09/2015  CLINICAL DATA:  Hypotension, malaise, lightheadedness, cough, fever, weight loss. EXAM: CT CHEST, ABDOMEN, AND PELVIS WITH CONTRAST TECHNIQUE: Multidetector CT imaging of the chest, abdomen and pelvis was performed following the standard protocol during bolus administration of intravenous contrast. CONTRAST:  170mL ISOVUE-300 IOPAMIDOL (ISOVUE-300) INJECTION 61% COMPARISON:  None. FINDINGS: CT CHEST FINDINGS Mediastinum/Lymph Nodes: No pathologically enlarged  mediastinal, hilar or axillary lymph nodes. Although this study is not optimized for the detection of pulmonary emboli, there is suspected intraluminal low-attenuation at the bifurcation of the left upper and left lower lobe pulmonary arteries, extending into the lobar and segmental pulmonary arteries. Pulmonary arteries are enlarged. Heart size is at the upper limits of normal. No pericardial  effusion. Tiny hiatal hernia. Lungs/Pleura: Image quality is degraded by respiratory motion and expiratory phase imaging. Probable atelectasis in the lower lobes. Trace bilateral pleural effusions. Airway is unremarkable. Musculoskeletal: No worrisome lytic or sclerotic lesions. CT ABDOMEN PELVIS FINDINGS Hepatobiliary: Liver and gallbladder are unremarkable. No biliary ductal dilatation. Pancreas: Negative. Spleen: Negative. Adrenals/Urinary Tract: Adrenal glands are unremarkable. Low-attenuation lesions in the kidneys measure up to 2.6 cm on the left and are likely cysts, statistically. Punctate stone in the right kidney. Ureters are decompressed. Bladder is low in volume. Stomach/Bowel: Small hiatal hernia. Stomach, small bowel and colon are unremarkable. Appendix is not readily visualized. Vascular/Lymphatic: Atherosclerotic calcification of the arterial vasculature without abdominal aortic aneurysm. No pathologically enlarged lymph nodes. Reproductive: Prostate is normal in size. Other: No free fluid.  Mesenteries and peritoneum are unremarkable. Musculoskeletal: No worrisome lytic or sclerotic lesions. Grade 1 anterolisthesis of L4 on L5. IMPRESSION: 1. Although this study is not optimized for the detection of pulmonary emboli, there is low-attenuation in the left pulmonary arterial tree, suspicious for pulmonary emboli. Critical Value/emergent results were called by telephone at the time of interpretation on 05/09/2015 at 2:43 pm to Dr. Cristal Ford , who verbally acknowledged these results. 2. Mild bilateral lower lobe atelectasis and trace bilateral pleural effusions. 3. Enlarged pulmonary arteries, indicative of pulmonary arterial hypertension. 4. Punctate right renal stone. Electronically Signed   By: Lorin Picket M.D.   On: 05/09/2015 14:46    Microbiology: Recent Results (from the past 240 hour(s))  Urine culture     Status: Abnormal   Collection Time: 05/07/15 10:23 PM  Result Value Ref  Range Status   Specimen Description URINE, RANDOM  Final   Special Requests NONE  Final   Culture MULTIPLE SPECIES PRESENT, SUGGEST RECOLLECTION (A)  Final   Report Status 05/09/2015 FINAL  Final  Respiratory virus panel     Status: None   Collection Time: 05/08/15  3:11 AM  Result Value Ref Range Status   Respiratory Syncytial Virus A Negative Negative Final   Respiratory Syncytial Virus B Negative Negative Final   Influenza A Negative Negative Final   Influenza B Negative Negative Final   Parainfluenza 1 Negative Negative Final   Parainfluenza 2 Negative Negative Final   Parainfluenza 3 Negative Negative Final   Metapneumovirus Negative Negative Final   Rhinovirus Negative Negative Final   Adenovirus Negative Negative Final    Comment: (NOTE) Performed At: [facility] [address] [place], [place] [id number] Lindon Romp MD [id number]   Culture, blood (routine x 2)     Status: None (Preliminary result)   Collection Time: 05/09/15  6:30 AM  Result Value Ref Range Status   Specimen Description BLOOD RIGHT HAND  Final   Special Requests BOTTLES DRAWN AEROBIC ONLY [place]  Final   Culture   Final    NO GROWTH 2 DAYS Performed at [facility]    Report Status PENDING  Incomplete  Culture, blood (routine x 2)     Status: None (Preliminary result)   Collection Time: 05/09/15  6:35 AM  Result Value Ref Range Status   Specimen Description BLOOD LEFT  ARM  Final   Special Requests BOTTLES DRAWN AEROBIC AND ANAEROBIC 10CC  Final   Culture   Final    NO GROWTH 2 DAYS Performed at [facility]    Report Status PENDING  Incomplete     Labs: Basic Metabolic Panel:  Recent Labs Lab 05/08/15 0508 05/09/15 0516 05/10/15 0357 05/11/15 0440 05/12/15 0509  NA 139 142 142 144 143  K 3.3* 3.2* 3.0* 3.6 3.6  CL 102 105 106 109 110  CO2 25 27 28 25 26   GLUCOSE 113* 127* 132* 104* 107*  BUN 34* 24* 13 13 13   CREATININE 1.27* 0.93 0.73 0.74 0.72    CALCIUM 9.3 8.8* 8.4* 8.5* 8.3*  MG 1.8  1.6*  --   --   --   --   PHOS 1.8*  1.8*  --  1.5* 1.7*  --    Liver Function Tests:  Recent Labs Lab 05/07/15 2116 05/08/15 0508 05/09/15 0516 05/10/15 0357 05/11/15 0440  AST 25 27 25 26 25   ALT 26 24 25 26 27   ALKPHOS 49 50 50 52 49  BILITOT 2.0* 2.0* 1.1 1.0 0.7  PROT 6.0* 5.9* 5.4* 5.0* 5.0*  ALBUMIN 3.2* 3.0* 2.7* 2.3* 2.3*   No results for input(s): LIPASE, AMYLASE in the last 168 hours. No results for input(s): AMMONIA in the last 168 hours. CBC:  Recent Labs Lab 05/07/15 2116 05/08/15 0508 05/10/15 0357 05/11/15 0440 05/12/15 0509  WBC 7.1 8.1 5.3 4.9 4.0  NEUTROABS 4.9  --   --   --   --   HGB 13.3 13.8 11.0* 11.7* 10.6*  HCT 38.8* 39.9 31.9* 34.9* 31.5*  MCV 90.4 90.7 89.6 90.9 93.2  PLT 164 160 166 183 217   Cardiac Enzymes: No results for input(s): CKTOTAL, CKMB, CKMBINDEX, TROPONINI in the last 168 hours. BNP: BNP (last 3 results)  Recent Labs  05/08/15 0508  BNP 33.2    ProBNP (last 3 results) No results for input(s): PROBNP in the last 8760 hours.  CBG: No results for input(s): GLUCAP in the last 168 hours.     SignedCristal Ford  [facility] 05/12/2015, 9:58 AM

## 2015-05-14 LAB — CULTURE, BLOOD (ROUTINE X 2)
Culture: NO GROWTH
Culture: NO GROWTH

## 2015-05-17 ENCOUNTER — Encounter: Payer: Self-pay | Admitting: Internal Medicine

## 2015-05-17 ENCOUNTER — Ambulatory Visit (INDEPENDENT_AMBULATORY_CARE_PROVIDER_SITE_OTHER): Payer: Medicare Other | Admitting: Internal Medicine

## 2015-05-17 VITALS — BP 130/80 | HR 83 | Ht 67.0 in | Wt 287.0 lb

## 2015-05-17 DIAGNOSIS — I2699 Other pulmonary embolism without acute cor pulmonale: Secondary | ICD-10-CM

## 2015-05-17 NOTE — Progress Notes (Signed)
Subjective:     Patient ID: Alan [name], male   DOB: [birth year],    MRN: [id number]  HPI  75 yowm quit smoking age 75  limited by obesity/ back pain but still able to cross parking lots/ shopping, some light yardwork/ one floor walking/  then admit PE    Admit date: 05/07/2015 Discharge date: 05/12/2015  Recommendations for Outpatient Follow-up:  Patient will be discharged to Home with home health PT and oxygen. Patient will need to follow up with primary care provider within one week of discharge. Discussed follow up with pulmonology, please call and schedule an appointment. Patient should continue medications as prescribed. Patient should follow a heart healthy diet.   Discharge Diagnoses:  Hypotension Bilateral pulmonary emboli/right lower extremity DVT Fever Acute bronchitis/questionable pneumonia Hypokalemia/hypophosphatemia Isolated hyperbilirubinemia Idiopathic sudden hearing loss Constipation Weight loss Generalized weakness  Discharge Condition: Stable  Diet recommendation: Heart healthy  Filed Weights   05/07/15 1839  Weight: 121.836 kg (268 lb 9.6 oz)    History of present illness:  On 05/08/2015 by Dr. Irish Lack is a 75 y.o. male with medical history significant of hearing loss, hypertension Presented with episode of hypotension in the office. He has has some episodes of malaise and occasional lightheaded spells. HE has not been checking blood pressure at home. He has felt weak all over.  He have had some cough for the past 24 hours. He was not even aware he had a fever. Patient was seen at PCP office was noted to be febrile and hypotensive 106/51. At baseline his BP 145/70 but he have not checked it for a while.  He denies any chest pain, no dyspnea, he have had poor appetite. He have had some nausea.   Hospital Course:  Hypotension -Resolved, Likely secondary to dehydration. -BP responded to IV fluids -Orthostatic vitals  unremarkable  Bilateral Pulmonary emboli/RLE DVT -CT chest with contrast obtained on 05/09/2015 which was suggestive of PE however not absolute -CTA chest on 05/10/2015: Bilateral pulmonary emboli with a single pulmonary embolus to segmental branch to the right lower lobe, several pulmonary emboli on the left. -Lower extremity Doppler: DVT involving right popliteal and peroneal veins -Patient was placed on full dose Lovenox on 05/09/2015, will transition to oral AC -Echocardiogram: EF 0000000, diastolic function parameters normal. PA peak pressure 66mmHG, Right ventricular systolic pressure increased-consistent with moderate pulmonary hypertension -Discussed follow up with pulmonary medicine. Patient will need anticoagulation workup and sleep study.  -Was initially placed on full dose lovenox, transitioned to Eliquis. Called for prior auth.  -case management consulted for medication coverage -Upon ambulation, patient's O2 sats dropped to 72%. Will discharge with oxygen therapy.   Fever -Patient has remained afebrile for more than 24 hours -Blood cultures show no growth to date -?related to DVT or PE -Currently on doxycycline -Obtain CT chest, abdomen and pelvis which did show some mild bilateral lower lobe atelectasis, trace below pleural effusions, right renal stone. -Chest x-ray UA unremarkable for infection -urine culture: multiple species  Acute bronchitis/?pneumonia -Appears to be improving. Continue bronchodilators, antibiotics -Chest x-ray remarkable for infection, suggestive of airway disease/bronchitis -Patient does have mild cough, continue antitussives -CT chest: Mild bilateral lower lobe atelectasis, trace bilateral pleural effusions. Enlarged pulmonary arteries indicative of pulmonary arterial hypertension -CTA chest: No opacity in the anterior medial left lower lobe  Hypokalemia/hypophosphatemia -Likely secondary to diuretics -Replacing, continue to monitor  Isolated  hyperbilirubinemia -Appears to be improving, no complaints of abdominal pain -Likely secondary  to dehydration -Abdominal x-ray unremarkable  Idiopathic sudden hearing loss -Patient follows with Dr. Constance Holster, ENT -Was treated with steroids (prednisone) for extended period of time  Constipation -was given suppository, and was able to have bowel movement  Unintentional weight loss -Given the patient does have history of melanoma -CT chest, abdomen, pelvis with contrast showed questionable PE, renal stone  Generalized weakness -PT consulted, recommended home health  Morbid obesity -Nutrition consulted -BMI 42 -Patient will need to follow up with his PCP for lifestyle modifications including diet and exercise.  Consultants None  Procedures  Echocardiogram Lower ext doppler        05/17/2015 1st [facility] office visit/consultation re PE rx/Keron Neenan  maint rx with eliquis 5mg  bid snce discharge  Chief Complaint  Patient presents with  . Hospitalization Follow-up    Pt states his breathing has improved back to his normal baseline. He c/o minimal swelling in his left foot for the past few days.   main symptom was fatigue and nausea as came off prednisone with incidentally noted R DVT / multiple PE during admit for weakness > now 02 dep 24/7 at 2lpm and making slow steady progress with home PT including one flight of steps. Never had hemoptysis, cp, calf pain, does have some swelling both legs chronically worse since R DVT   No obvious day to day or daytime variability or assoc chronic cough or cp or chest tightness, subjective wheeze or overt sinus or hb symptoms. No unusual exp hx or h/o childhood pna/ asthma or knowledge of premature birth.  Sleeping ok without nocturnal  or early am exacerbation  of respiratory  c/o's or need for noct saba. Also denies any obvious fluctuation of symptoms with weather or environmental changes or other aggravating or alleviating factors except  as outlined above   Current Medications, Allergies, Complete Past Medical History, Past Surgical History, Family History, and Social History were reviewed in [employer] record.  ROS  The following are not active complaints unless bolded sore throat, dysphagia, dental problems, itching, sneezing,  nasal congestion or excess/ purulent secretions, ear ache,   fever, chills, sweats, unintended wt loss, classically pleuritic or exertional cp, hemoptysis,  orthopnea pnd or leg swelling, presyncope, palpitations, abdominal pain, anorexia, nausea, vomiting, diarrhea  or change in bowel or bladder habits, change in stools or urine, dysuria,hematuria,  rash, arthralgias, visual complaints, headache, numbness, weakness or ataxia or problems with walking or coordination,  change in mood/affect or memory.          Review of Systems     Objective:   Physical Exam    obese w/c bound wm nad  Wt Readings from Last 3 Encounters:  05/17/15 287 lb (130.182 kg)  05/07/15 268 lb 9.6 oz (121.836 kg)  09/21/14 297 lb (134.718 kg)    Vital signs reviewed   HEENT: nl dentition, turbinates, and oropharynx. Nl external ear canals without cough reflex   NECK :  without JVD/Nodes/TM/ nl carotid upstrokes bilaterally   LUNGS: no acc muscle use,  Nl contour chest which is clear to A and P bilaterally without cough on insp or exp maneuvers   CV:  RRR  no s3 or murmur or increase in P2,   1-2+ pitting bilateral low ext pitting sym  edema   ABD:  Massively obese but soft and nontender with nl inspiratory excursion in the supine position. No bruits or organomegaly, bowel sounds nl  MS:  Nl gait/ ext warm without deformities, calf  tenderness, cyanosis or clubbing No obvious joint restrictions   SKIN: warm and dry without lesions    NEURO:  alert, approp, nl sensorium with  no motor deficits   Assessment:

## 2015-05-17 NOTE — Patient Instructions (Addendum)
Stay on the oxygen 2lpm at bedtime and ok to wean for saturation for 90% daytime with activity, no need at rest  Wear support stockings  At 6 months you need a repeat echo and venous dopplers R leg and decision about long term anticoagulation.  We can see you as needed at Dr Rolly Salter discretion if needed to participate in decision making regarding your blood clot risk with alternative see a blood clot specialist at [facility] = Dr Beryle Beams.  We also need to see you if you are losing ground with your recovery of your normal activity tolerance

## 2015-05-20 DIAGNOSIS — I2699 Other pulmonary embolism without acute cor pulmonale: Secondary | ICD-10-CM | POA: Insufficient documentation

## 2015-05-20 NOTE — Assessment & Plan Note (Signed)
See CTa 05/10/15 assoc with PAS 42 on Echo and R DVT   He really didn't have significant R Heart strain/ failure but needs a minimum of 6 m anticoagulation then re -eval need for longterm rx with alternative being lower dose eliquis vs high dose ASA (as the dvt/pe occurred on low dose asa) but my concern is that there is no good data on rx in the massively obese with NOAC's so there's no right answer here as to what to do p 6 months but clearly on the right path for now clinically  rec wt loss/ progressive mobilization and f/u here prn.   Consider Eval by Dr Beryle Beams at 6 months if Echo and venous dopplers neg

## 2015-05-20 NOTE — Assessment & Plan Note (Signed)
Body mass index is 44.94    Lab Results  Component Value Date   TSH 1.514 05/08/2015     Contributing to gerd tendency/ doe/reviewed the need and the process to achieve and maintain neg calorie balance > defer f/u primary care including intermittently monitoring thyroid status

## 2015-11-21 ENCOUNTER — Other Ambulatory Visit (HOSPITAL_COMMUNITY): Payer: Self-pay | Admitting: Internal Medicine

## 2015-11-21 DIAGNOSIS — I2699 Other pulmonary embolism without acute cor pulmonale: Secondary | ICD-10-CM

## 2015-11-21 DIAGNOSIS — R011 Cardiac murmur, unspecified: Secondary | ICD-10-CM

## 2015-11-21 DIAGNOSIS — R52 Pain, unspecified: Secondary | ICD-10-CM

## 2015-11-23 ENCOUNTER — Ambulatory Visit (HOSPITAL_COMMUNITY)
Admission: RE | Admit: 2015-11-23 | Discharge: 2015-11-23 | Disposition: A | Discharge status: Home / Self Care | Payer: Medicare Other | Source: Ambulatory Visit | Attending: Internal Medicine | Admitting: Internal Medicine

## 2015-11-23 ENCOUNTER — Ambulatory Visit (HOSPITAL_BASED_OUTPATIENT_CLINIC_OR_DEPARTMENT_OTHER)
Admission: RE | Admit: 2015-11-23 | Discharge: 2015-11-23 | Disposition: A | Discharge status: Home / Self Care | Payer: Medicare Other | Source: Ambulatory Visit | Attending: Internal Medicine | Admitting: Internal Medicine

## 2015-11-23 DIAGNOSIS — Z6841 Body Mass Index (BMI) 40.0 and over, adult: Secondary | ICD-10-CM | POA: Diagnosis not present

## 2015-11-23 DIAGNOSIS — I10 Essential (primary) hypertension: Secondary | ICD-10-CM | POA: Diagnosis not present

## 2015-11-23 DIAGNOSIS — R52 Pain, unspecified: Secondary | ICD-10-CM

## 2015-11-23 DIAGNOSIS — Z87891 Personal history of nicotine dependence: Secondary | ICD-10-CM | POA: Diagnosis not present

## 2015-11-23 DIAGNOSIS — R011 Cardiac murmur, unspecified: Secondary | ICD-10-CM

## 2015-11-23 DIAGNOSIS — Z86718 Personal history of other venous thrombosis and embolism: Secondary | ICD-10-CM | POA: Diagnosis present

## 2015-11-23 DIAGNOSIS — I351 Nonrheumatic aortic (valve) insufficiency: Secondary | ICD-10-CM | POA: Insufficient documentation

## 2015-11-23 NOTE — Progress Notes (Signed)
**  Preliminary report by tech**  Bilateral lower extremity venous duplex completed. There is no evidence of deep or superficial vein thrombosis involving the right and left lower extremities. All visualized vessels appear patent and compressible. There is no evidence of Baker's cysts bilaterally. Results given to Kerin Ransom at [phone].  11/23/15 2:36 PM Carlos Levering RVT

## 2015-11-23 NOTE — Progress Notes (Signed)
  Echocardiogram 2D Echocardiogram has been performed.  Tresa Res 11/23/2015, 2:42 PM

## 2016-08-19 ENCOUNTER — Other Ambulatory Visit: Payer: Self-pay

## 2016-08-19 ENCOUNTER — Ambulatory Visit (INDEPENDENT_AMBULATORY_CARE_PROVIDER_SITE_OTHER): Payer: Medicare Other | Admitting: Physician Assistant

## 2016-08-19 ENCOUNTER — Encounter: Payer: Self-pay | Admitting: Physician Assistant

## 2016-08-19 VITALS — BP 130/64 | HR 72 | Ht 67.0 in | Wt 272.0 lb

## 2016-08-19 DIAGNOSIS — Z7901 Long term (current) use of anticoagulants: Secondary | ICD-10-CM

## 2016-08-19 DIAGNOSIS — K5909 Other constipation: Secondary | ICD-10-CM

## 2016-08-19 DIAGNOSIS — R195 Other fecal abnormalities: Secondary | ICD-10-CM

## 2016-08-19 DIAGNOSIS — Z86010 Personal history of colonic polyps: Secondary | ICD-10-CM

## 2016-08-19 DIAGNOSIS — I82409 Acute embolism and thrombosis of unspecified deep veins of unspecified lower extremity: Secondary | ICD-10-CM

## 2016-08-19 DIAGNOSIS — D649 Anemia, unspecified: Secondary | ICD-10-CM

## 2016-08-19 HISTORY — DX: Acute embolism and thrombosis of unspecified deep veins of unspecified lower extremity: I82.409

## 2016-08-19 MED ORDER — NA SULFATE-K SULFATE-MG SULF 17.5-3.13-1.6 GM/177ML PO SOLN: 1.0000 | Freq: Once | ORAL | 0 refills | Status: DC

## 2016-08-19 NOTE — Patient Instructions (Signed)

## 2016-08-19 NOTE — Progress Notes (Addendum)
Subjective:    Patient ID: Alan [name], male    DOB: 1940/04/30, 76 y.o.   MRN: [id number]  HPI Alan [name] is a pleasant 76 year old white male, previously known to Dr. Deatra Ina, referred today by Dr. Levin Erp for anemia and Hemoccult-positive stool. Patient was last seen here when he underwent colonoscopy in 2016 for follow-up of adenomatous polyps. He was found to have moderate left colon diverticulosis and mild right colon diverticulosis, no recurrent polyps. He was told at that time that he would not need any follow-up colonoscopies due to age. Patient had recent labs done on 07/30/2016 showing hemoglobin 12.9, hematocrit of 39.6 MCV of 83.7 platelets 199, this was down from his baseline hemoglobin of 15. Iron 46, TIBC 454 and iron saturation of 10. He did subsequently Hemoccult cards which returned positive. Patient has been on Eliquis since May 2017 when he had a DVT and PE. He states he is being maintained on eliquis because his clots were non-provoked. History also pertinent for hypertension. Patient states that he has not seen any melena or hematochezia. His bowel habits or always somewhat irregular and at times does have constipation. He has no complaints of abdominal discomfort. Appetite has been fine and weight has been stable, no significant heartburn indigestion or dysphagia. He has not been using any NSAIDs. Review of Systems Pertinent positive and negative review of systems were noted in the above HPI section.  All other review of systems was otherwise negative.  Outpatient Encounter Prescriptions as of 08/19/2016  Medication Sig  . beta carotene w/minerals (OCUVITE) tablet Take 1 tablet by mouth daily.  Marland Kitchen docusate sodium (COLACE) 250 MG capsule Take 250 mg by mouth daily.  . hydrochlorothiazide (HYDRODIURIL) 25 MG tablet Take 25 mg by mouth daily.  Marland Kitchen losartan (COZAAR) 100 MG tablet Take 100 mg by mouth daily.  Marland Kitchen omeprazole (PRILOSEC) 20 MG capsule Take 20 mg by mouth daily.  . OXYGEN  2lpm 24/7  . apixaban (ELIQUIS) 5 MG TABS tablet Take 2 tablets (10 mg total) by mouth 2 (two) times daily.  . [DISCONTINUED] apixaban (ELIQUIS) 5 MG TABS tablet Take 1 tablet (5 mg total) by mouth 2 (two) times daily. (Patient not taking: Reported on 05/17/2015)  . [DISCONTINUED] benzonatate (TESSALON) 200 MG capsule Take 1 capsule (200 mg total) by mouth 2 (two) times daily.  . [DISCONTINUED] Na Sulfate-K Sulfate-Mg Sulf 17.5-3.13-1.6 GM/180ML SOLN Take 1 kit by mouth once.   No facility-administered encounter medications on file as of 08/19/2016.    Allergies  Allergen Reactions  . Shellfish Allergy Nausea And Vomiting   Patient Active Problem List   Diagnosis Date Noted  . DVT (deep venous thrombosis) ([place]) 08/19/2016  . Pulmonary embolism ([place]) 05/20/2015  . Morbid (severe) obesity due to excess calories ([place]) 05/20/2015  . Hypokalemia 05/08/2015  . Acute bronchitis 05/08/2015  . Dehydration 05/08/2015  . Hearing loss 05/08/2015  . Essential hypertension 05/08/2015  . Right bundle branch block (RBBB) 05/08/2015  . Hypophosphatemia 05/08/2015  . SIRS (systemic inflammatory response syndrome) ([place]) 05/08/2015  . Constipation   . Hypotension 05/07/2015   Social History   Social History  . Marital status: Widowed    Spouse name: N/A  . Number of children: N/A  . Years of education: N/A   Occupational History  . Not on file.   Social History Main Topics  . Smoking status: Former Smoker    Packs/day: 1.00    Years: 17.00    Types: Cigarettes  Quit date: 01/06/1974  . Smokeless tobacco: Never Used  . Alcohol use 0.0 oz/week  . Drug use: No  . Sexual activity: Not on file   Other Topics Concern  . Not on file   Social History Narrative  . No narrative on file    Mr. Alan [name] family history includes Diabetes in his brother, father, and mother; Heart disease in his father and mother; Hypertension in his mother; Stroke in his mother.      Objective:    Vitals:     08/19/16 1004  BP: 130/64  Pulse: 72    Physical Exam  well-developed older white male in no acute distress, pleasant blood pressure 130/64 pulse 72, height 5 foot 7, weight 272, BMI 42.6. HEENT; nontraumatic normocephalic EOMI PERRLA sclerae anicteric, Cardiovascular; regular rate and rhythm with S1-S2 no murmur rub or gallop, Pulmonary; clear bilaterally, Abdomen; morbidly obese, soft, nontender nondistended bowel sounds are active there is no palpable mass or hepatosplenomegaly, Rectal ;exam not done recently documented Hemoccult positive, Extremities ;no clubbing cyanosis or edema skin warm and dry, Neuropsych ;mood and affect appropriate       Assessment & Plan:   #72 76 year old white male iron deficiency and mild anemia with Hemoccult-positive stool in setting of chronic anticoagulation with Eliquis. 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 Carlean Purl. Both procedures discussed in detail with patient including risks and benefits and he is agreeable to proceed. Patient will   hold Eliquis for 24 hours prior to procedures. We will communicate with Dr. Zada Girt to assure that this is reasonable in this patient. I don't believe he has started an iron supplement as yet, we will start ferrous sulfate 325 mg by mouth twice a day.  Amy S Esterwood PA-C 08/19/2016   Cc: Levin Erp, MD  Agree with Ms. Genia Harold assessment and plan. Gatha Mayer, MD, [facility]

## 2016-09-18 ENCOUNTER — Encounter: Payer: Self-pay | Admitting: Internal Medicine

## 2016-09-18 ENCOUNTER — Ambulatory Visit (AMBULATORY_SURGERY_CENTER): Payer: Medicare Other | Admitting: Internal Medicine

## 2016-09-18 VITALS — BP 105/58 | HR 64 | Temp 97.7°F | Resp 13 | Ht 71.0 in | Wt 272.0 lb

## 2016-09-18 DIAGNOSIS — K2951 Unspecified chronic gastritis with bleeding: Secondary | ICD-10-CM | POA: Diagnosis not present

## 2016-09-18 DIAGNOSIS — K317 Polyp of stomach and duodenum: Secondary | ICD-10-CM | POA: Diagnosis not present

## 2016-09-18 DIAGNOSIS — D509 Iron deficiency anemia, unspecified: Secondary | ICD-10-CM | POA: Diagnosis present

## 2016-09-18 DIAGNOSIS — K2960 Other gastritis without bleeding: Secondary | ICD-10-CM | POA: Diagnosis not present

## 2016-09-18 DIAGNOSIS — R195 Other fecal abnormalities: Secondary | ICD-10-CM

## 2016-09-18 NOTE — Op Note (Signed)
[place] Patient Name: Alan [name] Procedure Date: 09/18/2016 2:49 PM MRN: [id number] Endoscopist: Gatha Mayer , MD Age: 76 Referring MD:  Date of Birth: [birth year] Gender: Male Account #: [id number] Procedure:                Upper GI endoscopy Indications:              Iron deficiency anemia, Heme positive stool Medicines:                Propofol per Anesthesia, Monitored Anesthesia Care Procedure:                Pre-Anesthesia Assessment:                           - Prior to the procedure, a History and Physical                            was performed, and patient medications and                            allergies were reviewed. The patient's tolerance of                            previous anesthesia was also reviewed. The risks                            and benefits of the procedure and the sedation                            options and risks were discussed with the patient.                            All questions were answered, and informed consent                            was obtained. Prior Anticoagulants: The patient                            last took Eliquis (apixaban) 2 days prior to the                            procedure. ASA Grade Assessment: III - A patient                            with severe systemic disease. After reviewing the                            risks and benefits, the patient was deemed in                            satisfactory condition to undergo the procedure.                           After obtaining informed consent, the endoscope was  passed under direct vision. Throughout the                            procedure, the patient's blood pressure, pulse, and                            oxygen saturations were monitored continuously. The                            Endoscope was introduced through the mouth, and                            advanced to the second part of duodenum. The upper             GI endoscopy was accomplished without difficulty.                            The patient tolerated the procedure well. Scope In: Scope Out: Findings:                 Diffuse moderate inflammation characterized by                            congestion (edema), erosions, erythema and                            friability was found in the gastric antrum.                            Biopsies were taken with a cold forceps for                            histology. Verification of patient identification                            for the specimen was done. Estimated blood loss was                            minimal.                           Multiple small semi-sessile polyps with no stigmata                            of recent bleeding were found in the gastric body.                            Biopsies were taken with a cold forceps for                            histology. Verification of patient identification                            for the specimen was done. Estimated blood loss was  minimal.                           The cardia and gastric fundus were normal on                            retroflexion.                           The exam was otherwise without abnormality. Complications:            No immediate complications. Estimated Blood Loss:     Estimated blood loss was minimal. Impression:               - Gastritis. Biopsied.                           - Multiple gastric polyps. Biopsied.                           - The examination was otherwise normal. Recommendation:           - Patient has a contact number available for                            emergencies. The signs and symptoms of potential                            delayed complications were discussed with the                            patient. Return to normal activities tomorrow.                            Written discharge instructions were provided to the                             patient.                           - Resume previous diet.                           - Continue present medications.                           - See the other procedure note for documentation of                            additional recommendations.                           - Await pathology results.                           - Will consider med changes after pathology review                            -  is on 20 mg omeprazole currently - will adjust                            dose, Tx H pylori if present, consider sucralfate Gatha Mayer, MD 09/18/2016 3:19:45 PM This report has been signed electronically.

## 2016-09-18 NOTE — Progress Notes (Signed)
Called to room to assist during endoscopic procedure.  Patient ID and intended procedure confirmed with present staff. Received instructions for my participation in the procedure from the performing physician.  

## 2016-09-18 NOTE — Progress Notes (Signed)
Report given to PACU, vss 

## 2016-09-18 NOTE — Op Note (Signed)
[place] Patient Name: Alan [name] Procedure Date: 09/18/2016 2:49 PM MRN: [id number] Endoscopist: Gatha Mayer , MD Age: 76 Referring MD:  Date of Birth: 1940-06-14 Gender: Male Account #: [id number] Procedure:                Colonoscopy Indications:              Heme positive stool, Iron deficiency anemia Medicines:                Propofol per Anesthesia, Monitored Anesthesia Care Procedure:                Pre-Anesthesia Assessment:                           - Prior to the procedure, a History and Physical                            was performed, and patient medications and                            allergies were reviewed. The patient's tolerance of                            previous anesthesia was also reviewed. The risks                            and benefits of the procedure and the sedation                            options and risks were discussed with the patient.                            All questions were answered, and informed consent                            was obtained. Prior Anticoagulants: The patient                            last took Eliquis (apixaban) 2 days prior to the                            procedure. ASA Grade Assessment: III - A patient                            with severe systemic disease. After reviewing the                            risks and benefits, the patient was deemed in                            satisfactory condition to undergo the procedure.                           After obtaining informed consent, the colonoscope  was passed under direct vision. Throughout the                            procedure, the patient's blood pressure, pulse, and                            oxygen saturations were monitored continuously. The                            Colonoscope was introduced through the anus and                            advanced to the the cecum, identified by   appendiceal orifice and ileocecal valve. The                            ileocecal valve, appendiceal orifice, and rectum                            were photographed. The quality of the bowel                            preparation was good. The bowel preparation used                            was Miralax. Scope In: 3:00:50 PM Scope Out: 3:09:39 PM Scope Withdrawal Time: 0 hours 6 minutes 47 seconds  Total Procedure Duration: 0 hours 8 minutes 49 seconds  Findings:                 The perianal and digital rectal examinations were                            normal. Pertinent negatives include normal prostate                            (size, shape, and consistency).                           Multiple small and large-mouthed diverticula were                            found in the sigmoid colon. There was no evidence                            of diverticular bleeding.                           The exam was otherwise without abnormality on                            direct and retroflexion views. Complications:            No immediate complications. Estimated blood loss:  None. Estimated Blood Loss:     Estimated blood loss: none. Recommendation:           - Resume previous diet.                           - Continue present medications.                           - No repeat colonoscopy due to age.                           - see EGD - had erosive gastris so sume that was                            source of heme + stool and likely iron-deficency                            anemia in setting of Eliquis use Gatha Mayer, MD 09/18/2016 3:22:27 PM This report has been signed electronically.

## 2016-09-18 NOTE — Patient Instructions (Addendum)
There is inflammation in the stomach - erosive gastritis. Likely leaking blood from there.  No other source of blood loss seen.  Some benign-appearing stomach polyps - biopsied.  Diverticulosis (pockets or pouches) in the colon - no polyps or cancer seen.  Restart Eliquis today.  Once I see biopsy results will make medication changes perhaps.  I appreciate the opportunity to care for you. Gatha Mayer, MD, FACG YOU HAD AN ENDOSCOPIC PROCEDURE TODAY AT [facility]:   Refer to the procedure report that was given to you for any specific questions about what was found during the examination.  If the procedure report does not answer your questions, please call your gastroenterologist to clarify.  If you requested that your care partner not be given the details of your procedure findings, then the procedure report has been included in a sealed envelope for you to review at your convenience later.  YOU SHOULD EXPECT: Some feelings of bloating in the abdomen. Passage of more gas than usual.  Walking can help get rid of the air that was put into your GI tract during the procedure and reduce the bloating. If you had a lower endoscopy (such as a colonoscopy or flexible sigmoidoscopy) you may notice spotting of blood in your stool or on the toilet paper. If you underwent a bowel prep for your procedure, you may not have a normal bowel movement for a few days.  Please Note:  You might notice some irritation and congestion in your nose or some drainage.  This is from the oxygen used during your procedure.  There is no need for concern and it should clear up in a day or so.  SYMPTOMS TO REPORT IMMEDIATELY:   Following lower endoscopy (colonoscopy or flexible sigmoidoscopy):  Excessive amounts of blood in the stool  Significant tenderness or worsening of abdominal pains  Swelling of the abdomen that is new, acute  Fever of 100F or higher   Following upper endoscopy  (EGD)  Vomiting of blood or coffee ground material  New chest pain or pain under the shoulder blades  Painful or persistently difficult swallowing  New shortness of breath  Fever of 100F or higher  Black, tarry-looking stools  For urgent or emergent issues, a gastroenterologist can be reached at any hour by calling [phone].   DIET:  We do recommend a small meal at first, but then you may proceed to your regular diet.  Drink plenty of fluids but you should avoid alcoholic beverages for 24 hours.  ACTIVITY:  You should plan to take it easy for the rest of today and you should NOT DRIVE or use heavy machinery until tomorrow (because of the sedation medicines used during the test).    FOLLOW UP: Our staff will call the number listed on your records the next business day following your procedure to check on you and address any questions or concerns that you may have regarding the information given to you following your procedure. If we do not reach you, we will leave a message.  However, if you are feeling well and you are not experiencing any problems, there is no need to return our call.  We will assume that you have returned to your regular daily activities without incident.  If any biopsies were taken you will be contacted by phone or by letter within the next 1-3 weeks.  Please call us at [phone] if you have not heard about the biopsies in 3  weeks.    SIGNATURES/CONFIDENTIALITY: You and/or your care partner have signed paperwork which will be entered into your electronic medical record.  These signatures attest to the fact that that the information above on your After Visit Summary has been reviewed and is understood.  Full responsibility of the confidentiality of this discharge information lies with you and/or your care-partner.  Handouts given on gastritis, and diverticulosis.  Thank you for letting us take care of your healthcare needs today! :)

## 2016-09-19 ENCOUNTER — Telehealth: Payer: Self-pay | Admitting: *Deleted

## 2016-09-19 NOTE — Telephone Encounter (Signed)
  Follow up Call-  Call back number 09/18/2016 09/21/2014  Post procedure Call Back phone  # [phone] hm [phone]  Permission to leave phone message Yes Yes  Some recent data might be hidden     Patient questions:  Do you have a fever, pain , or abdominal swelling? No. Pain Score  0 *  Have you tolerated food without any problems? Yes.    Have you been able to return to your normal activities? Yes.    Do you have any questions about your discharge instructions: Diet   No. Medications  No. Follow up visit  No.  Do you have questions or concerns about your Care? No.  Actions: * If pain score is 4 or above: No action needed, pain <4.

## 2016-09-26 NOTE — Progress Notes (Signed)
Call patient or family - bxs show inflammation - no cancer or infection  Changes:  1) Make omeprazole 40 mg po daily # 30 11 RF 2) Take Carafate 1 g qid/ac #120 11 RF 3) No recall needed, f/u PCP see GI prn

## 2016-09-29 ENCOUNTER — Other Ambulatory Visit: Payer: Self-pay

## 2016-09-29 MED ORDER — OMEPRAZOLE 40 MG PO CPDR: 40.0000 mg | DELAYED_RELEASE_CAPSULE | Freq: Every day | ORAL | 11 refills | Status: DC

## 2016-09-29 MED ORDER — SUCRALFATE 1 G PO TABS: 1.0000 g | ORAL_TABLET | Freq: Three times a day (TID) | ORAL | 11 refills | Status: DC

## 2017-02-09 ENCOUNTER — Other Ambulatory Visit: Payer: Self-pay | Admitting: Dermatology

## 2017-08-28 ENCOUNTER — Other Ambulatory Visit: Payer: Self-pay | Admitting: Internal Medicine

## 2017-08-28 DIAGNOSIS — K118 Other diseases of salivary glands: Secondary | ICD-10-CM

## 2017-09-01 ENCOUNTER — Other Ambulatory Visit: Payer: Self-pay | Admitting: Internal Medicine

## 2017-09-01 DIAGNOSIS — K111 Hypertrophy of salivary gland: Secondary | ICD-10-CM

## 2017-09-09 ENCOUNTER — Ambulatory Visit
Admission: RE | Admit: 2017-09-09 | Discharge: 2017-09-09 | Disposition: A | Discharge status: Home / Self Care | Payer: Medicare Other | Source: Ambulatory Visit | Attending: Internal Medicine | Admitting: Internal Medicine

## 2017-09-09 DIAGNOSIS — K111 Hypertrophy of salivary gland: Secondary | ICD-10-CM

## 2017-09-09 MED ORDER — IOPAMIDOL (ISOVUE-300) INJECTION 61%
75.0000 mL | Freq: Once | INTRAVENOUS | Status: AC | PRN
  Administered 2017-09-09: 75 mL via INTRAVENOUS

## 2017-10-20 ENCOUNTER — Other Ambulatory Visit: Payer: Self-pay | Admitting: Dermatology

## 2018-02-16 ENCOUNTER — Other Ambulatory Visit: Payer: Self-pay | Admitting: Dermatology

## 2018-08-09 ENCOUNTER — Other Ambulatory Visit: Payer: Self-pay | Admitting: Dermatology

## 2020-02-13 ENCOUNTER — Encounter: Payer: Self-pay | Admitting: Internal Medicine

## 2020-02-23 ENCOUNTER — Encounter: Payer: Self-pay | Admitting: Dermatology

## 2020-02-23 ENCOUNTER — Other Ambulatory Visit: Payer: Self-pay

## 2020-02-23 ENCOUNTER — Ambulatory Visit (INDEPENDENT_AMBULATORY_CARE_PROVIDER_SITE_OTHER): Payer: Medicare Other | Admitting: Dermatology

## 2020-02-23 DIAGNOSIS — L820 Inflamed seborrheic keratosis: Secondary | ICD-10-CM

## 2020-02-23 DIAGNOSIS — Z1283 Encounter for screening for malignant neoplasm of skin: Secondary | ICD-10-CM | POA: Diagnosis not present

## 2020-02-23 DIAGNOSIS — L821 Other seborrheic keratosis: Secondary | ICD-10-CM

## 2020-02-23 DIAGNOSIS — Z85828 Personal history of other malignant neoplasm of skin: Secondary | ICD-10-CM

## 2020-02-23 DIAGNOSIS — Z8589 Personal history of malignant neoplasm of other organs and systems: Secondary | ICD-10-CM

## 2020-02-23 DIAGNOSIS — Z85820 Personal history of malignant melanoma of skin: Secondary | ICD-10-CM | POA: Diagnosis not present

## 2020-02-26 ENCOUNTER — Encounter: Payer: Self-pay | Admitting: Dermatology

## 2020-02-26 NOTE — Progress Notes (Signed)
   Follow-Up Visit   Subjective  Alan [name] is a 80 y.o. male who presents for the following: Annual Exam (Left side under the eye raised, waxy, noticed 6-8 weeks-it was a dark spot).  New growth Location: Lower left inner eye Duration:  Quality:  Associated Signs/Symptoms: Modifying Factors:  Severity:  Timing: Context:   Objective  Well appearing patient in no apparent distress; mood and affect are within normal limits. Objective  Neck to Head: No signs of atypical moles, melanoma or non mole skin cancer.  Objective  Left Malar Cheek: Clinically typical tan textured flattopped 9 mm papule (patient shown in mirror and confirm that this was the spot he was concerned about).  Dermoscopic changes of seborrheic keratosis (and a common cave area so a bit more challenging)  Images    Objective  Right Forehead: Stuck-on, waxy papules and plaques.     A focused examination was performed including Head and neck.. Relevant physical exam findings are noted in the Assessment and Plan.   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  Seborrheic keratosis Right Forehead  Benign okay to leave unless patient wants removed.      I, Lavonna Monarch, MD, have reviewed all documentation for this visit.  The documentation on 02/26/20 for the exam, diagnosis, procedures, and orders are all accurate and complete.

## 2020-03-21 ENCOUNTER — Telehealth: Payer: Self-pay

## 2020-03-21 ENCOUNTER — Encounter: Payer: Self-pay | Admitting: Internal Medicine

## 2020-03-21 ENCOUNTER — Ambulatory Visit (INDEPENDENT_AMBULATORY_CARE_PROVIDER_SITE_OTHER): Payer: Medicare Other | Admitting: Internal Medicine

## 2020-03-21 ENCOUNTER — Other Ambulatory Visit (INDEPENDENT_AMBULATORY_CARE_PROVIDER_SITE_OTHER): Payer: Medicare Other

## 2020-03-21 VITALS — BP 130/78 | HR 82 | Ht 67.0 in | Wt 267.0 lb

## 2020-03-21 DIAGNOSIS — K2960 Other gastritis without bleeding: Secondary | ICD-10-CM

## 2020-03-21 DIAGNOSIS — Z7901 Long term (current) use of anticoagulants: Secondary | ICD-10-CM | POA: Diagnosis not present

## 2020-03-21 DIAGNOSIS — R1310 Dysphagia, unspecified: Secondary | ICD-10-CM

## 2020-03-21 DIAGNOSIS — R195 Other fecal abnormalities: Secondary | ICD-10-CM

## 2020-03-21 LAB — CBC WITH DIFFERENTIAL/PLATELET
Basophils Absolute: 0.1 10*3/uL (ref 0.0–0.1)
Basophils Relative: 0.9 % (ref 0.0–3.0)
Eosinophils Absolute: 0.1 10*3/uL (ref 0.0–0.7)
Eosinophils Relative: 1.1 % (ref 0.0–5.0)
HCT: 40.7 % (ref 39.0–52.0)
Hemoglobin: 13.3 g/dL (ref 13.0–17.0)
Lymphocytes Relative: 21.1 % (ref 12.0–46.0)
Lymphs Abs: 1.3 10*3/uL (ref 0.7–4.0)
MCHC: 32.8 g/dL (ref 30.0–36.0)
MCV: 77.8 fl — ABNORMAL LOW (ref 78.0–100.0)
Monocytes Absolute: 0.6 10*3/uL (ref 0.1–1.0)
Monocytes Relative: 9 % (ref 3.0–12.0)
Neutro Abs: 4.2 10*3/uL (ref 1.4–7.7)
Neutrophils Relative %: 67.9 % (ref 43.0–77.0)
Platelets: 193 10*3/uL (ref 150.0–400.0)
RBC: 5.23 Mil/uL (ref 4.22–5.81)
RDW: 16.2 % — ABNORMAL HIGH (ref 11.5–15.5)
WBC: 6.2 10*3/uL (ref 4.0–10.5)

## 2020-03-21 LAB — FERRITIN: Ferritin: 7.1 ng/mL — ABNORMAL LOW (ref 22.0–322.0)

## 2020-03-21 NOTE — Progress Notes (Signed)
Alan [name] 80 y.o. 29-Nov-1940 [id number]  Assessment & Plan:   Encounter Diagnoses  Name Primary?  . Heme + stool Yes  . Long term current use of anticoagulant - apixaban   . Erosive gastritis   . Dysphagia - transient     It is hard to think he has a significant colonic lesion though he did have an I FOBT positive.  I think it is reasonable to start with an EGD to evaluate his gastritis again.  Particularly in light of his transient dysphagia last fall.  I think his iFOB T could have been positive from hemorrhoids or something small and insignificant in the setting of Eliquis.  The risks and benefits as well as alternatives of endoscopic procedure(s) have been discussed and reviewed. All questions answered. The patient agrees to proceed.  Rare but real extra risk of recurrent blood clot and PE off Eliquis explained to the patient.  Anticipate holding it for 2 days and restarting it promptly after his procedure.  He should not have routine Hemoccult testing ever again.  I appreciate the opportunity to care for this patient.  CC: Sueanne Margarita, DO  Subjective:   Chief Complaint: Heme positive stool  HPI The patient is an 80 year old white man previously seen by me for iron deficiency anemia in 2018, who takes Eliquis.  He recently had Hemoccult testing through his primary care provider and had an I FOBT positive and then 203 guaiac cards positive.  His hemoglobin is normal his iron deficiency has resolved.  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  A colonoscopy demonstrated diverticulosis and hemorrhoids.  He denies any signs of bleeding.  His bowel habits have been regular.  His prior primary care provider changed his medication after his iron deficiency was treated. Pantoprazole was reduced from 40 mg to 20 mg daily and Carafate was stopped.   There is no unintentional weight loss or  other abdominal pain or GI problems.  He continues to take Eliquis because of a history of pulmonary embolism. Allergies  Allergen Reactions  . Shellfish Allergy Nausea And Vomiting    Current Outpatient Medications:  .  hydrochlorothiazide (HYDRODIURIL) 25 MG tablet, Take 25 mg by mouth daily., Disp: , Rfl:  .  losartan (COZAAR) 100 MG tablet, Take 100 mg by mouth daily., Disp: , Rfl: 4 .  apixaban (ELIQUIS) 5 MG TABS tablet, Take 2 tablets (10 mg total) by mouth 2 (two) times daily., Disp: 28 tablet, Rfl: 0 .  omeprazole (PRILOSEC) 20 MG capsule, Take 20 mg by mouth daily., Disp: , Rfl:   Past Medical History:  Diagnosis Date  . Allergy   . Arthritis   . Basal cell carcinoma (BCC)   . Cataract    left eye, right cateract removed  . DVT (deep venous thrombosis) ([place]) 08/19/2016  . GERD (gastroesophageal reflux disease)   . Hearing loss 05/08/2015  . Hypertension   . Iron deficiency anemia   . Melanoma ([place])    melanoma on back 2009,   . Obesity   . Oxygen deficiency    May 2017 d/c on o2 - used less than 2 months - no longer on o2 at home. [place]  . Pulmonary embolism [facility])    May 2017  . Right bundle branch block (RBBB)   . SIRS (systemic inflammatory response syndrome) ([place]) 05/08/2015   Past Surgical History:  Procedure  Laterality Date  . APPENDECTOMY    . CATARACT EXTRACTION    . COLONOSCOPY    . ESOPHAGOGASTRODUODENOSCOPY    . TONSILLECTOMY    . WISDOM TOOTH EXTRACTION     Social History   Social History Narrative   Widowed 2 daughters   Retired Nurse, children's   Former smoker   No EtOH   3 caffeine/day   No drugs   family history includes Diabetes in his brother, daughter, father, and mother; Heart disease in his father and mother; Hypertension in his mother; Stroke in his mother.   Review of Systems As per HPI  Objective:   Physical Exam BP 130/78   Pulse 82   Ht 5\' 7"  (1.702 m)   Wt 267 lb (121.1 kg)   BMI 41.82 kg/m  Obese pleasant wm NAD Lungs  cta Cor NL S1S2 no rmg abd obese soft NT no masses bowel sounds present Alert and oriented x3 Appropriate mood and affect  Data reviewed includes those things mentioned in the HPI and labs and office visit from primary care provider from early 2022

## 2020-03-21 NOTE — Telephone Encounter (Signed)
   Alan [name] 1940/03/01 [id number]  Dear Dr Sueanne Margarita:  We have scheduled the above named patient for a(n) EGD procedure. Our records show that (s)he is on anticoagulation therapy.  Please advise as to whether the patient may come off their therapy of Eliquis 2  days prior to their procedure which is scheduled for 04/05/2020.  Please route your response to [name], [place] ([facility]) or fax response to [phone]  Sincerely,  Silvano Rusk, MD, [facility]

## 2020-03-21 NOTE — Patient Instructions (Signed)
You have been scheduled for an endoscopy. Please follow written instructions given to you at your visit today. If you use inhalers (even only as needed), please bring them with you on the day of your procedure.  You will be contacted by our office prior to your procedure for directions on holding your Eliquis..  If you do not hear from our office 1 week prior to your scheduled procedure, please call [phone] to discuss.   Your provider has requested that you go to the basement level for lab work before leaving today. Press "B" on the elevator. The lab is located at the first door on the left as you exit the elevator.  Due to recent changes in healthcare laws, you may see the results of your imaging and laboratory studies on MyChart before your provider has had a chance to review them.  We understand that in some cases there may be results that are confusing or concerning to you. Not all laboratory results come back in the same time frame and the provider may be waiting for multiple results in order to interpret others.  Please give [place] 48 hours in order for your provider to thoroughly review all the results before contacting the office for clarification of your results.   I appreciate the opportunity to care for you. Silvano Rusk, MD, [facility]

## 2020-03-28 ENCOUNTER — Other Ambulatory Visit: Payer: Self-pay

## 2020-03-28 MED ORDER — IRON 325 (65 FE) MG PO TABS: 325.0000 mg | ORAL_TABLET | Freq: Every day | ORAL | 0 refills | Status: AC

## 2020-03-28 NOTE — Telephone Encounter (Signed)
Left messages on both his numbers to call me back. We received a fax with approval to hold the Eliquis for 2 days prior to his upcoming procedure.

## 2020-03-29 NOTE — Telephone Encounter (Signed)
Patient returned your call, please call patient one more time.   

## 2020-03-29 NOTE — Telephone Encounter (Signed)
Alan [name] informed and verbalized understanding.

## 2020-04-05 ENCOUNTER — Other Ambulatory Visit: Payer: Self-pay

## 2020-04-05 ENCOUNTER — Encounter: Payer: Self-pay | Admitting: Internal Medicine

## 2020-04-05 ENCOUNTER — Ambulatory Visit (AMBULATORY_SURGERY_CENTER): Payer: Medicare Other | Admitting: Internal Medicine

## 2020-04-05 VITALS — BP 115/56 | HR 56 | Temp 97.8°F | Resp 15 | Ht 67.0 in | Wt 267.0 lb

## 2020-04-05 DIAGNOSIS — K2960 Other gastritis without bleeding: Secondary | ICD-10-CM | POA: Diagnosis not present

## 2020-04-05 DIAGNOSIS — K317 Polyp of stomach and duodenum: Secondary | ICD-10-CM | POA: Diagnosis not present

## 2020-04-05 DIAGNOSIS — K449 Diaphragmatic hernia without obstruction or gangrene: Secondary | ICD-10-CM | POA: Diagnosis not present

## 2020-04-05 DIAGNOSIS — D509 Iron deficiency anemia, unspecified: Secondary | ICD-10-CM

## 2020-04-05 DIAGNOSIS — R1310 Dysphagia, unspecified: Secondary | ICD-10-CM

## 2020-04-05 MED ORDER — SODIUM CHLORIDE 0.9 % IV SOLN
500.0000 mL | Freq: Once | INTRAVENOUS | Status: AC
Start: 2020-04-05 — End: ?

## 2020-04-05 NOTE — Progress Notes (Signed)
Pt's states no medical or surgical changes since previsit or office visit.  CW - vitals 

## 2020-04-05 NOTE — Op Note (Addendum)
[facility] Patient Name: Alan [name] Procedure Date: 04/05/2020 1:32 PM MRN: [id number] Endoscopist: Gatha Mayer , MD Age: 80 Referring MD:  Date of Birth: [birth year] Gender: Male Account #: [id number] Procedure:                Upper GI endoscopy Indications:              Iron deficiency anemia, Heme positive stool Medicines:                Propofol per Anesthesia, Monitored Anesthesia Care Procedure:                Pre-Anesthesia Assessment:                           - Prior to the procedure, a History and Physical                            was performed, and patient medications and                            allergies were reviewed. The patient's tolerance of                            previous anesthesia was also reviewed. The risks                            and benefits of the procedure and the sedation                            options and risks were discussed with the patient.                            All questions were answered, and informed consent                            was obtained. Prior Anticoagulants: The patient                            last took Eliquis (apixaban) 2 days prior to the                            procedure. ASA Grade Assessment: III - A patient                            with severe systemic disease. After reviewing the                            risks and benefits, the patient was deemed in                            satisfactory condition to undergo the procedure.                           After obtaining informed consent, the endoscope was  passed under direct vision. Throughout the                            procedure, the patient's blood pressure, pulse, and                            oxygen saturations were monitored continuously. The                            Endoscope was introduced through the mouth, and                            advanced to the second part of duodenum. The upper                             GI endoscopy was accomplished without difficulty.                            The patient tolerated the procedure well. Scope In: Scope Out: Findings:                 The examined esophagus was mildly tortuous.                           A 4 cm hiatal hernia was present.                           Multiple diminutive sessile polyps were found in                            the gastric fundus and in the gastric body.                           Diffuse moderate inflammation characterized by                            erythema, friability and mucus was found in the                            gastric antrum.                           The examined duodenum was normal.                           The cardia and gastric fundus were normal on                            retroflexion.                           The exam was otherwise without abnormality. Complications:            No immediate complications. Estimated Blood Loss:     Estimated blood loss: none. Impression:               -  Tortuous esophagus.                           - 4 cm hiatal hernia.                           - Multiple gastric polyps.                           - Chronic gastritis.                           - Normal examined duodenum.                           - The examination was otherwise normal. No cxause                            of iron-deficiency anemia or heme + stool (which                            was reason for exams in 2018 also)                           - No specimens collected. Recommendation:           - Patient has a contact number available for                            emergencies. The signs and symptoms of potential                            delayed complications were discussed with the                            patient. Return to normal activities tomorrow.                            Written discharge instructions were provided to the                            patient.                            - Resume previous diet.                           - Continue present medications.                           - CBC and ferritin first week of May                           considre capsule endoscopy ? repeat colonoscopy                            DISCUSSED IN RECOVERY - WILL DO CAPSULE ENDOSCOPY  OF SMALL BOWEL                           - Resume Eliquis (apixaban) at prior dose today. Gatha Mayer, MD 04/05/2020 1:55:15 PM This report has been signed electronically.

## 2020-04-05 NOTE — Progress Notes (Signed)
To Pacu, VSS. Report to [email or web address] 

## 2020-04-05 NOTE — Patient Instructions (Addendum)
The endoscopy showed innocent stomach polyps, chronic gastritis without signs of bleeding this time, and a hiatal hernia.  This does not give [place] an answer about the blood in the stool or the low iron with certainty.   Last colonoscopy was 3-1/2 years ago.  It is possible we might find something else on repeating that but I am not convinced we need to do that again.  I am considering having you do what is called a capsule endoscopy of the small intestine.  I will explain that.   Right now I am going to recommend you return in the first week of May to recheck your iron levels. Please come to the lab at that time and I will contact you when the results are in and we can determine the next step.  I appreciate the opportunity to care for you. Gatha Mayer, MD, FACG   YOU HAD AN ENDOSCOPIC PROCEDURE TODAY AT [facility]:   Refer to the procedure report that was given to you for any specific questions about what was found during the examination.  If the procedure report does not answer your questions, please call your gastroenterologist to clarify.  If you requested that your care partner not be given the details of your procedure findings, then the procedure report has been included in a sealed envelope for you to review at your convenience later.  YOU SHOULD EXPECT: Some feelings of bloating in the abdomen. Passage of more gas than usual.  Walking can help get rid of the air that was put into your GI tract during the procedure and reduce the bloating. If you had a lower endoscopy (such as a colonoscopy or flexible sigmoidoscopy) you may notice spotting of blood in your stool or on the toilet paper. If you underwent a bowel prep for your procedure, you may not have a normal bowel movement for a few days.  Please Note:  You might notice some irritation and congestion in your nose or some drainage.  This is from the oxygen used during your procedure.  There is no need for concern and  it should clear up in a day or so.  SYMPTOMS TO REPORT IMMEDIATELY:     Following upper endoscopy (EGD)  Vomiting of blood or coffee ground material  New chest pain or pain under the shoulder blades  Painful or persistently difficult swallowing  New shortness of breath  Fever of 100F or higher  Black, tarry-looking stools  For urgent or emergent issues, a gastroenterologist can be reached at any hour by calling [phone]. Do not use MyChart messaging for urgent concerns.    DIET:  We do recommend a small meal at first, but then you may proceed to your regular diet.  Drink plenty of fluids but you should avoid alcoholic beverages for 24 hours.  MEDICATIONS: Continue present medications. Resume Eliquis (Apixaban) at prior dose today.  FOLLOW UP: Come in to lab for a blood draw the first week of may (CBC and Ferritin). Consider capsule endoscopy or possibly repeat colonoscopy?  Please see handouts given to you by your recovery nurse.  Thank you for allowing [place] to provide for your healthcare needs today.  ACTIVITY:  You should plan to take it easy for the rest of today and you should NOT DRIVE or use heavy machinery until tomorrow (because of the sedation medicines used during the test).    FOLLOW UP: Our staff will call the number listed on your records 48-72  hours following your procedure to check on you and address any questions or concerns that you may have regarding the information given to you following your procedure. If we do not reach you, we will leave a message.  We will attempt to reach you two times.  During this call, we will ask if you have developed any symptoms of COVID 19. If you develop any symptoms (ie: fever, flu-like symptoms, shortness of breath, cough etc.) before then, please call [phone].  If you test positive for Covid 19 in the 2 weeks post procedure, please call and report this information to [place].    If any biopsies were taken you will be  contacted by phone or by letter within the next 1-3 weeks.  Please call us at [phone] if you have not heard about the biopsies in 3 weeks.    SIGNATURES/CONFIDENTIALITY: You and/or your care partner have signed paperwork which will be entered into your electronic medical record.  These signatures attest to the fact that that the information above on your After Visit Summary has been reviewed and is understood.  Full responsibility of the confidentiality of this discharge information lies with you and/or your care-partner.

## 2020-04-09 ENCOUNTER — Telehealth: Payer: Self-pay

## 2020-04-09 DIAGNOSIS — R195 Other fecal abnormalities: Secondary | ICD-10-CM

## 2020-04-09 DIAGNOSIS — D509 Iron deficiency anemia, unspecified: Secondary | ICD-10-CM

## 2020-04-09 NOTE — Addendum Note (Signed)
Addended by: Marlon Pel on: 04/09/2020 09:59 AM   Modules accepted: Orders

## 2020-04-09 NOTE — Telephone Encounter (Signed)
Left message for patient to call back  

## 2020-04-09 NOTE — Telephone Encounter (Signed)
-----   Message from Gatha Mayer, MD sent at 04/05/2020  4:01 PM EDT ----- Regarding: needs capsule Please do capsule endoscopy  Heme + Iron deficiency anemia

## 2020-04-09 NOTE — Telephone Encounter (Signed)
Patient has been scheduled for 04/17/20.  He is aware to look for his instructions in his MyChart. He will let me know if he has any questions.

## 2020-04-17 ENCOUNTER — Other Ambulatory Visit: Payer: Self-pay

## 2020-04-17 ENCOUNTER — Ambulatory Visit (INDEPENDENT_AMBULATORY_CARE_PROVIDER_SITE_OTHER): Payer: Medicare Other | Admitting: Internal Medicine

## 2020-04-17 ENCOUNTER — Encounter: Payer: Self-pay | Admitting: Internal Medicine

## 2020-04-17 DIAGNOSIS — R195 Other fecal abnormalities: Secondary | ICD-10-CM

## 2020-04-17 DIAGNOSIS — D509 Iron deficiency anemia, unspecified: Secondary | ICD-10-CM

## 2020-04-17 DIAGNOSIS — K5520 Angiodysplasia of colon without hemorrhage: Secondary | ICD-10-CM | POA: Diagnosis not present

## 2020-04-17 NOTE — Progress Notes (Signed)
SN: [id number] Exp: 07/17/2021 LPN:[id number] Patient arrived for Capsule Endoscopy. Reported the prep went well. This nurse explained dietary restrictions for the next few hours. Patient verbalized understanding. Opened capsule, ensured capsule was flashing prior to the patient swallowing the capsule. Patient swallowed capsule without difficulty. Patient instructed to return to the office at 4:00 pm today for removal of the recording equipment, to call the office with any questions and if no capsule was visualized after 72 hours. No further questions by the conclusion of the visit.

## 2020-04-26 ENCOUNTER — Telehealth: Payer: Self-pay | Admitting: Internal Medicine

## 2020-04-26 DIAGNOSIS — T189XXA Foreign body of alimentary tract, part unspecified, initial encounter: Secondary | ICD-10-CM

## 2020-04-26 NOTE — Telephone Encounter (Signed)
Patient reports he is not sure he passed the capsule endoscopy.  He will come in for a KUB at his next convenience.

## 2020-04-26 NOTE — Telephone Encounter (Signed)
Patient returned call. Best contact number is [phone]

## 2020-04-26 NOTE — Telephone Encounter (Signed)
Patient notified He will come for labs the first week in May

## 2020-04-26 NOTE — Addendum Note (Signed)
Addended by: Marlon Pel on: 04/26/2020 02:14 PM   Modules accepted: Orders

## 2020-04-26 NOTE — Telephone Encounter (Signed)
Tried to call the patient but no answer.  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  We saw 1 undoubtedly he has more and leaks blood from these especially because he uses Eliquis.  This is not dangerous he just needs to stay on iron and make sure his iron levels and blood counts come up.  He is coming in May for labs just remind him of that and we will follow-up more after I see those results.  No change to medications.

## 2020-05-07 ENCOUNTER — Other Ambulatory Visit: Payer: Self-pay

## 2020-05-07 ENCOUNTER — Other Ambulatory Visit (INDEPENDENT_AMBULATORY_CARE_PROVIDER_SITE_OTHER): Payer: Medicare Other

## 2020-05-07 ENCOUNTER — Ambulatory Visit (INDEPENDENT_AMBULATORY_CARE_PROVIDER_SITE_OTHER)
Admission: RE | Admit: 2020-05-07 | Discharge: 2020-05-07 | Disposition: A | Discharge status: Home / Self Care | Payer: Medicare Other | Source: Ambulatory Visit | Attending: Internal Medicine | Admitting: Internal Medicine

## 2020-05-07 DIAGNOSIS — D509 Iron deficiency anemia, unspecified: Secondary | ICD-10-CM

## 2020-05-07 DIAGNOSIS — T189XXA Foreign body of alimentary tract, part unspecified, initial encounter: Secondary | ICD-10-CM

## 2020-05-07 LAB — CBC
HCT: 43.3 % (ref 39.0–52.0)
Hemoglobin: 14.4 g/dL (ref 13.0–17.0)
MCHC: 33.3 g/dL (ref 30.0–36.0)
MCV: 82.4 fl (ref 78.0–100.0)
Platelets: 171 10*3/uL (ref 150.0–400.0)
RBC: 5.25 Mil/uL (ref 4.22–5.81)
RDW: 20 % — ABNORMAL HIGH (ref 11.5–15.5)
WBC: 6.9 10*3/uL (ref 4.0–10.5)

## 2020-05-07 LAB — FERRITIN: Ferritin: 21.9 ng/mL — ABNORMAL LOW (ref 22.0–322.0)

## 2020-08-06 ENCOUNTER — Ambulatory Visit: Payer: Medicare Other | Admitting: Dermatology

## 2020-09-24 ENCOUNTER — Other Ambulatory Visit: Payer: Self-pay

## 2020-09-24 ENCOUNTER — Ambulatory Visit (INDEPENDENT_AMBULATORY_CARE_PROVIDER_SITE_OTHER): Payer: Medicare Other | Admitting: Dermatology

## 2020-09-24 DIAGNOSIS — D0461 Carcinoma in situ of skin of right upper limb, including shoulder: Secondary | ICD-10-CM

## 2020-09-24 DIAGNOSIS — Z1283 Encounter for screening for malignant neoplasm of skin: Secondary | ICD-10-CM

## 2020-09-24 DIAGNOSIS — D692 Other nonthrombocytopenic purpura: Secondary | ICD-10-CM

## 2020-09-24 DIAGNOSIS — Z85828 Personal history of other malignant neoplasm of skin: Secondary | ICD-10-CM | POA: Diagnosis not present

## 2020-09-24 DIAGNOSIS — D485 Neoplasm of uncertain behavior of skin: Secondary | ICD-10-CM

## 2020-09-24 NOTE — Patient Instructions (Signed)

## 2020-10-07 ENCOUNTER — Encounter: Payer: Self-pay | Admitting: Dermatology

## 2020-10-07 NOTE — Progress Notes (Signed)
   Follow-Up Visit   Subjective  Alan [name] is a 80 y.o. male who presents for the following: Annual Exam (Here for annual skin exam. No concerns. History of non mole skin cancers. ).  Skin examination, new spot on right arm Location:  Duration:  Quality:  Associated Signs/Symptoms: Modifying Factors:  Severity:  Timing: Context:   Objective  Well appearing patient in no apparent distress; mood and affect are within normal limits. Mid Back Waist up skin exam.  No atypical pigmented lesions.  1 possible new nonmelanoma skin cancer right arm will be biopsied and treated.  Left Forearm - Posterior, Right Forearm - Posterior Half dozen 1 cm areas of purpura forearms.  Denies other abnormal bleeding.  Right Upper Arm - Posterior Waxy 9 mm pink crust, rule out superficial carcinoma         All skin waist up examined.   Assessment & Plan    Screening for malignant neoplasm of skin Mid Back  Yearly skin exam.  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 Right Upper Arm - Posterior  Skin / nail biopsy Type of biopsy: tangential   Informed consent: discussed and consent obtained   Timeout: patient name, date of birth, surgical site, and procedure verified   Anesthesia: the lesion was anesthetized in a standard fashion   Anesthetic:  1% lidocaine w/ epinephrine 1-100,000 local infiltration Instrument used: flexible razor blade   Hemostasis achieved with: ferric subsulfate   Outcome: patient tolerated procedure well   Post-procedure details: wound care instructions given    Destruction of lesion Complexity: simple   Destruction method: electrodesiccation and curettage   Informed consent: discussed and consent obtained   Timeout:  patient name, date of birth, surgical site, and procedure verified Anesthesia: the lesion was  anesthetized in a standard fashion   Anesthetic:  1% lidocaine w/ epinephrine 1-100,000 local infiltration Curettage performed in three different directions: Yes   Curettage cycles:  3 Lesion length (cm):  1.1 Lesion width (cm):  1.1 Margin per side (cm):  0 Final wound size (cm):  1.1 Hemostasis achieved with:  aluminum chloride Outcome: patient tolerated procedure well with no complications   Post-procedure details: wound care instructions given    Specimen 1 - Surgical pathology Differential Diagnosis: scc vs bcc  Check Margins: No  After shave biopsy the base was treated with curettage plus cautery      I, Lavonna Monarch, MD, have reviewed all documentation for this visit.  The documentation on 10/07/20 for the exam, diagnosis, procedures, and orders are all accurate and complete.

## 2021-08-20 ENCOUNTER — Other Ambulatory Visit: Payer: Self-pay | Admitting: Otolaryngology

## 2021-08-26 ENCOUNTER — Other Ambulatory Visit: Payer: Self-pay

## 2021-08-26 NOTE — Progress Notes (Signed)
Patient was called with questions and instructions for the surgery day. Patient denied any contact with somebody tested positive for COVID or with symptoms of COVID. All instructions explained to the patient, with a verbal understanding of the material. The opportunity to ask questions was provided. Patient verbalized that last day of Eliquis was 08/24/21    Anesthesia review: yes - cardiac history  Patient verbally denies any shortness of breath, fever, cough and chest pain during phone call   -------------  SDW INSTRUCTIONS given:  Your procedure is scheduled on Tuesday, August 22nd, 2023.  Report to [facility] Main Entrance "A" at 13:10 o'clock, and check in at the Admitting office.  Call this number if you have problems the morning of surgery:  [phone]   Remember:  Do not eat after midnight the night before your surgery  You may drink clear liquids until 12:30 the day of your surgery.   Clear liquids allowed are: Water, Non-Citrus Juices (without pulp), Carbonated Beverages, Clear Tea, Black Coffee Only, and Gatorade    Take these medicines the morning of surgery with A SIP OF WATER Prilosec, Flonase, eye drops  As of today, STOP taking any Aspirin (unless otherwise instructed by your surgeon) Aleve, Naproxen, Ibuprofen, Motrin, Advil, Goody's, BC's, all herbal medications, fish oil, and all vitamins.   The day of surgery:                     Do not wear jewelry            Do not wear lotions, powders, colognes, or deodorant.            Do not shave 48 hours prior to surgery.  Men may shave face and neck.            Do not bring valuables to the hospital.            [facility] is not responsible for any belongings or valuables.  Do NOT Smoke (Tobacco/Vaping) 24 hours prior to your procedure If you use a CPAP at night, you may bring all equipment for your overnight stay.   Contacts, glasses, dentures or bridgework may not be worn into surgery.      For patients  admitted to the hospital, discharge time will be determined by your treatment team.   Patients discharged the day of surgery will not be allowed to drive home, and someone needs to stay with them for 24 hours.    Special instructions:   [place]- Preparing For Surgery  Before surgery, you can play an important role. Because skin is not sterile, your skin needs to be as free of germs as possible. You can reduce the number of germs on your skin by washing with CHG (chlorahexidine gluconate) Soap before surgery.  CHG is an antiseptic cleaner which kills germs and bonds with the skin to continue killing germs even after washing.    Oral Hygiene is also important to reduce your risk of infection.  Remember - BRUSH YOUR TEETH THE MORNING OF SURGERY WITH YOUR REGULAR TOOTHPASTE  Please do not use if you have an allergy to CHG or antibacterial soaps. If your skin becomes reddened/irritated stop using the CHG.  Do not shave (including legs and underarms) for at least 48 hours prior to first CHG shower. It is OK to shave your face.  Please follow these instructions carefully.   Shower the NIGHT BEFORE SURGERY and the MORNING OF SURGERY with DIAL Soap.  Pat yourself dry with a CLEAN TOWEL.  Wear CLEAN PAJAMAS to bed the night before surgery  Place CLEAN SHEETS on your bed the night of your first shower and DO NOT SLEEP WITH PETS.   Day of Surgery: Please shower morning of surgery  Wear Clean/Comfortable clothing the morning of surgery Do not apply any deodorants/lotions.   Remember to brush your teeth WITH YOUR REGULAR TOOTHPASTE.   Questions were answered. Patient verbalized understanding of instructions.

## 2021-08-27 ENCOUNTER — Encounter (HOSPITAL_COMMUNITY): Payer: Self-pay | Admitting: Otolaryngology

## 2021-08-27 ENCOUNTER — Other Ambulatory Visit: Payer: Self-pay

## 2021-08-27 ENCOUNTER — Ambulatory Visit (HOSPITAL_COMMUNITY): Payer: Medicare Other | Admitting: Physician Assistant

## 2021-08-27 ENCOUNTER — Ambulatory Visit (HOSPITAL_COMMUNITY)
Admission: RE | Admit: 2021-08-27 | Discharge: 2021-08-27 | Disposition: A | Discharge status: Home / Self Care | Payer: Medicare Other | Attending: Otolaryngology | Admitting: Otolaryngology

## 2021-08-27 ENCOUNTER — Ambulatory Visit (HOSPITAL_BASED_OUTPATIENT_CLINIC_OR_DEPARTMENT_OTHER): Payer: Medicare Other | Admitting: Physician Assistant

## 2021-08-27 ENCOUNTER — Encounter (HOSPITAL_COMMUNITY)
Admission: RE | Disposition: A | Discharge status: Home / Self Care | Payer: Self-pay | Source: Home / Self Care | Attending: Otolaryngology

## 2021-08-27 DIAGNOSIS — Z87891 Personal history of nicotine dependence: Secondary | ICD-10-CM | POA: Insufficient documentation

## 2021-08-27 DIAGNOSIS — D1802 Hemangioma of intracranial structures: Secondary | ICD-10-CM | POA: Insufficient documentation

## 2021-08-27 DIAGNOSIS — Z79899 Other long term (current) drug therapy: Secondary | ICD-10-CM | POA: Insufficient documentation

## 2021-08-27 DIAGNOSIS — I10 Essential (primary) hypertension: Secondary | ICD-10-CM | POA: Insufficient documentation

## 2021-08-27 DIAGNOSIS — M1990 Unspecified osteoarthritis, unspecified site: Secondary | ICD-10-CM | POA: Diagnosis not present

## 2021-08-27 DIAGNOSIS — J320 Chronic maxillary sinusitis: Secondary | ICD-10-CM | POA: Diagnosis present

## 2021-08-27 DIAGNOSIS — J3489 Other specified disorders of nose and nasal sinuses: Secondary | ICD-10-CM | POA: Diagnosis not present

## 2021-08-27 DIAGNOSIS — K219 Gastro-esophageal reflux disease without esophagitis: Secondary | ICD-10-CM | POA: Insufficient documentation

## 2021-08-27 DIAGNOSIS — I82409 Acute embolism and thrombosis of unspecified deep veins of unspecified lower extremity: Secondary | ICD-10-CM | POA: Diagnosis not present

## 2021-08-27 DIAGNOSIS — I4510 Unspecified right bundle-branch block: Secondary | ICD-10-CM | POA: Insufficient documentation

## 2021-08-27 DIAGNOSIS — Z86718 Personal history of other venous thrombosis and embolism: Secondary | ICD-10-CM | POA: Diagnosis not present

## 2021-08-27 DIAGNOSIS — I452 Bifascicular block: Secondary | ICD-10-CM | POA: Diagnosis not present

## 2021-08-27 DIAGNOSIS — Z6841 Body Mass Index (BMI) 40.0 and over, adult: Secondary | ICD-10-CM | POA: Diagnosis not present

## 2021-08-27 HISTORY — PX: NASAL ENDOSCOPY: SHX6577

## 2021-08-27 LAB — CBC
HCT: 45.6 % (ref 39.0–52.0)
Hemoglobin: 15.7 g/dL (ref 13.0–17.0)
MCH: 31.6 pg (ref 26.0–34.0)
MCHC: 34.4 g/dL (ref 30.0–36.0)
MCV: 91.8 fL (ref 80.0–100.0)
Platelets: 172 10*3/uL (ref 150–400)
RBC: 4.97 MIL/uL (ref 4.22–5.81)
RDW: 12.1 % (ref 11.5–15.5)
WBC: 6.6 10*3/uL (ref 4.0–10.5)
nRBC: 0 % (ref 0.0–0.2)

## 2021-08-27 LAB — BASIC METABOLIC PANEL
Anion gap: 9 (ref 5–15)
BUN: 19 mg/dL (ref 8–23)
CO2: 23 mmol/L (ref 22–32)
Calcium: 9.7 mg/dL (ref 8.9–10.3)
Chloride: 108 mmol/L (ref 98–111)
Creatinine, Ser: 0.87 mg/dL (ref 0.61–1.24)
GFR, Estimated: >60 mL/min (ref >60)
Glucose, Bld: 106 mg/dL — ABNORMAL HIGH (ref 70–99)
Potassium: 3.4 mmol/L — ABNORMAL LOW (ref 3.5–5.1)
Sodium: 140 mmol/L (ref 135–145)

## 2021-08-27 SURGERY — ENDOSCOPY, NOSE
Anesthesia: General | Site: Nose | Laterality: Bilateral

## 2021-08-27 MED ORDER — SUGAMMADEX SODIUM 200 MG/2ML IV SOLN
INTRAVENOUS | Status: DC | PRN
  Administered 2021-08-27: 245 mg via INTRAVENOUS

## 2021-08-27 MED ORDER — LACTATED RINGERS IV SOLN: INTRAVENOUS | Status: DC

## 2021-08-27 MED ORDER — LIDOCAINE-EPINEPHRINE 1 %-1:100000 IJ SOLN
INTRAMUSCULAR | Status: DC | PRN
  Administered 2021-08-27: 9 mL

## 2021-08-27 MED ORDER — 0.9 % SODIUM CHLORIDE (POUR BTL) OPTIME
TOPICAL | Status: DC | PRN
  Administered 2021-08-27: 1000 mL

## 2021-08-27 MED ORDER — ONDANSETRON HCL 4 MG/2ML IJ SOLN
INTRAMUSCULAR | Status: AC
  Filled 2021-08-27: qty 2

## 2021-08-27 MED ORDER — DEXAMETHASONE SODIUM PHOSPHATE 10 MG/ML IJ SOLN
INTRAMUSCULAR | Status: DC | PRN
  Administered 2021-08-27: 10 mg via INTRAVENOUS

## 2021-08-27 MED ORDER — LIDOCAINE 2% (20 MG/ML) 5 ML SYRINGE
INTRAMUSCULAR | Status: AC
Start: 2021-08-27 — End: ?
  Filled 2021-08-27: qty 5

## 2021-08-27 MED ORDER — ONDANSETRON HCL 4 MG/2ML IJ SOLN
INTRAMUSCULAR | Status: DC | PRN
  Administered 2021-08-27: 4 mg via INTRAVENOUS

## 2021-08-27 MED ORDER — ROCURONIUM BROMIDE 10 MG/ML (PF) SYRINGE
PREFILLED_SYRINGE | INTRAVENOUS | Status: AC
  Filled 2021-08-27: qty 10

## 2021-08-27 MED ORDER — CEFAZOLIN SODIUM-DEXTROSE 2-4 GM/100ML-% IV SOLN
2.0000 g | INTRAVENOUS | Status: AC
  Administered 2021-08-27: 2 g via INTRAVENOUS
  Filled 2021-08-27: qty 100

## 2021-08-27 MED ORDER — PROPOFOL 10 MG/ML IV BOLUS
INTRAVENOUS | Status: AC
  Filled 2021-08-27: qty 20

## 2021-08-27 MED ORDER — LIDOCAINE-EPINEPHRINE 1 %-1:100000 IJ SOLN
INTRAMUSCULAR | Status: AC
  Filled 2021-08-27: qty 1

## 2021-08-27 MED ORDER — LIDOCAINE 2% (20 MG/ML) 5 ML SYRINGE
INTRAMUSCULAR | Status: AC
  Filled 2021-08-27: qty 5

## 2021-08-27 MED ORDER — EPINEPHRINE PF 1 MG/ML IJ SOLN
INTRAMUSCULAR | Status: AC
  Filled 2021-08-27: qty 1

## 2021-08-27 MED ORDER — FENTANYL CITRATE (PF) 250 MCG/5ML IJ SOLN
INTRAMUSCULAR | Status: AC
  Filled 2021-08-27: qty 5

## 2021-08-27 MED ORDER — ACETAMINOPHEN 500 MG PO TABS
1000.0000 mg | ORAL_TABLET | Freq: Once | ORAL | Status: AC
Start: 2021-08-27 — End: 2021-08-27
  Administered 2021-08-27: 1000 mg via ORAL
  Filled 2021-08-27: qty 2

## 2021-08-27 MED ORDER — HEMOSTATIC AGENTS (NO CHARGE) OPTIME
TOPICAL | Status: DC | PRN
  Administered 2021-08-27: 1

## 2021-08-27 MED ORDER — BACITRACIN ZINC 500 UNIT/GM EX OINT
TOPICAL_OINTMENT | CUTANEOUS | Status: DC | PRN
  Administered 2021-08-27: 1 via TOPICAL

## 2021-08-27 MED ORDER — CHLORHEXIDINE GLUCONATE 0.12 % MT SOLN
15.0000 mL | OROMUCOSAL | Status: AC
  Administered 2021-08-27: 15 mL via OROMUCOSAL
  Filled 2021-08-27 (×2): qty 15

## 2021-08-27 MED ORDER — LIDOCAINE 2% (20 MG/ML) 5 ML SYRINGE
INTRAMUSCULAR | Status: DC | PRN
  Administered 2021-08-27: 60 mg via INTRAVENOUS

## 2021-08-27 MED ORDER — DEXAMETHASONE SODIUM PHOSPHATE 10 MG/ML IJ SOLN
INTRAMUSCULAR | Status: AC
  Filled 2021-08-27: qty 1

## 2021-08-27 MED ORDER — ROCURONIUM BROMIDE 10 MG/ML (PF) SYRINGE
PREFILLED_SYRINGE | INTRAVENOUS | Status: DC | PRN
  Administered 2021-08-27: 50 mg via INTRAVENOUS

## 2021-08-27 MED ORDER — FENTANYL CITRATE (PF) 250 MCG/5ML IJ SOLN
INTRAMUSCULAR | Status: DC | PRN
  Administered 2021-08-27: 50 ug via INTRAVENOUS

## 2021-08-27 MED ORDER — PROPOFOL 10 MG/ML IV BOLUS
INTRAVENOUS | Status: DC | PRN
  Administered 2021-08-27: 140 mg via INTRAVENOUS

## 2021-08-27 MED ORDER — LIDOCAINE-EPINEPHRINE (PF) 1 %-1:200000 IJ SOLN
INTRAMUSCULAR | Status: AC
  Filled 2021-08-27: qty 30

## 2021-08-27 MED ORDER — OXYMETAZOLINE HCL 0.05 % NA SOLN
NASAL | Status: DC | PRN
  Administered 2021-08-27: 1

## 2021-08-27 MED ORDER — OXYMETAZOLINE HCL 0.05 % NA SOLN
NASAL | Status: AC
  Filled 2021-08-27: qty 30

## 2021-08-27 MED ORDER — BACITRACIN ZINC 500 UNIT/GM EX OINT
TOPICAL_OINTMENT | CUTANEOUS | Status: AC
Start: 2021-08-27 — End: ?
  Filled 2021-08-27: qty 28.35

## 2021-08-27 SURGICAL SUPPLY — 17 items
BAG COUNTER SPONGE SURGICOUNT (BAG) IMPLANT
BAG SPNG CNTER NS LX DISP (BAG) ×1
COAGULATOR SUCT SWTCH 10FR 6 (ELECTROSURGICAL) IMPLANT
COVER MAYO STAND STRL (DRAPES) IMPLANT
DRSG NASOPORE 8CM (GAUZE/BANDAGES/DRESSINGS) IMPLANT
ELECT REM PT RETURN 9FT ADLT (ELECTROSURGICAL) ×1
ELECTRODE REM PT RTRN 9FT ADLT (ELECTROSURGICAL) IMPLANT
GAUZE 4X4 16PLY ~~LOC~~+RFID DBL (SPONGE) ×2 IMPLANT
GLOVE ECLIPSE 7.5 STRL STRAW (GLOVE) ×2 IMPLANT
GLOVE SURG LTX SZ6.5 (GLOVE) ×2 IMPLANT
GOWN STRL REUS W/ TWL LRG LVL3 (GOWN DISPOSABLE) ×4 IMPLANT
GOWN STRL REUS W/TWL LRG LVL3 (GOWN DISPOSABLE) ×2
HEMOSTAT ARISTA ABSORB 3G PWDR (HEMOSTASIS) IMPLANT
PATTIES SURGICAL .5 X3 (DISPOSABLE) ×2 IMPLANT
TOWEL GREEN STERILE (TOWEL DISPOSABLE) IMPLANT
TRAY ENT MC OR (CUSTOM PROCEDURE TRAY) ×2 IMPLANT
YANKAUER SUCT BULB TIP NO VENT (SUCTIONS) IMPLANT

## 2021-08-27 NOTE — Discharge Instructions (Signed)
[facility] ([facility]) Post Operative Instructions  Office: [phone]  The Surgery Itself Endoscopic sinus surgery (with or without septoplasty and turbinate reduction) involves general anesthesia, typically for one to two hours. Patients may be sedated for several hours after surgery and may remain sleepy for the better part of the day. Nausea and vomiting are occasionally seen, and usually resolve by the evening of surgery - even without additional medications. Almost all patients can go home the day of surgery.  After Surgery  Facial pressure and fullness similar to a sinus infection/headache is normal after surgery. Breathing through your nose is also difficult due to swelling. A humidifier or vaporizer can be used in the bedroom to prevent throat pain with mouth breathing.   Bloody nasal drainage is normal after this surgery for 5-7 days, usually decreasing in volume with each day that passes. Drainage will flow from the front of the nose and down the back of the throat. Make sure you spit out blood drainage that drips down the back of your throat to prevent nausea/vomiting. You will have a nasal drip pad/sling with gauze to catch drainage from the front of your nose. The dressing may need to be changed frequently during the first 24 hours following surgery. In case of profuse nasal bleeding, you may apply ice to the bridge of the nose and pinch the nose just above the tip and hold for 10 minutes; if bleeding continues, contact the doctors office.   Frequent hot showers or saline nasal rinses (NeilMed) will help break up congestion and clear any clot or mucus that builds up within the nose after surgery. This can be started the day after surgery.   It is more comfortable to sleep with extra pillows or in a recliner for the first few days after surgery until the drainage begins to resolve.    Do not blow your nose for 2 weeks after surgery.   Avoid lifting > 10 lbs. and no  vigorous exercise for 2 weeks after Surgery.   Avoid airplane travel for 2 weeks following sinus surgery; the cabin pressure changes can cause pain and swelling within the nose/sinuses.   Sense of smell and taste are often diminished for several weeks after surgery. There may be some tenderness or numbness in your upper front teeth, which is normal after surgery. You may express old clot, discolored mucus or very large nasal crusts from your nose for up to 3-4 weeks after surgery; depending on how frequently and how effectively you irrigate your nose with the saltwater spray.   You may have absorbable sutures inside of your nose after surgery that will slowly dissolve in 2-3 weeks. Be careful when clearing crusts from the nose since they may be attached to these sutures.  Medications  Pain medication can be used for pain as prescribed. Pain and pressure in the nose is expected after surgery. As the surgical site heals, pain will resolve over the course of a week. Pain medications can cause nausea, which can be prevented if you take them with food or milk.   You may be given an antibiotic for one week after surgery to prevent infection. Take this medication with food to prevent nausea or vomiting.   You can use 2 nasal sprays after surgery: Afrin can be used up to 2 times a day for up to 5 days after surgery (best before bed) to reduce bloody drainage from the nose for the first few days after surgery. Saline/salt   water spray can be used as often as you would like starting the day after surgery to prevent crusting inside of the nose.   Take all of your routine medications as prescribed, unless told otherwise by your surgeon. Any medications that thin the blood should be avoided. This includes aspirin. Avoid aspirin-like products for the first 72 hours after surgery (Advil, Motrin, Excedrin, Alieve, Celebrex, Naprosyn), but you may use them as needed for pain after 72 hours.   

## 2021-08-27 NOTE — Transfer of Care (Signed)
Immediate Anesthesia Transfer of Care Note  Patient: Alan [name]  Procedure(s) Performed: NASAL ENDOSCOPY WITH REMOVAL OF NASAL MASS (Bilateral: Nose)  Patient Location: PACU  Anesthesia Type:General  Level of Consciousness: awake, alert  and oriented  Airway & Oxygen Therapy: Patient Spontanous Breathing and Patient connected to face mask oxygen  Post-op Assessment: Report given to RN, Post -op Vital signs reviewed and stable, Patient moving all extremities X 4 and Patient able to stick tongue midline  Post vital signs: Reviewed  Last Vitals:  Vitals Value Taken Time  BP 147/73   Temp 97.6   Pulse 71   Resp 12   SpO2 99     Last Pain:  Vitals:   08/27/21 1339  TempSrc:   PainSc: 0-No pain      Patients Stated Pain Goal: 0 (21/74/71 5953)  Complications: No notable events documented.

## 2021-08-27 NOTE — Anesthesia Procedure Notes (Signed)
Procedure Name: Intubation Date/Time: 08/27/2021 5:40 PM  Performed by: Maude Leriche, CRNAPre-anesthesia Checklist: Patient identified, Emergency Drugs available, Suction available and Patient being monitored Patient Re-evaluated:Patient Re-evaluated prior to induction Oxygen Delivery Method: Circle system utilized Preoxygenation: Pre-oxygenation with 100% oxygen Induction Type: IV induction Ventilation: Mask ventilation without difficulty Laryngoscope Size: Miller and 2 Grade View: Grade I Tube type: Oral Tube size: 7.5 mm Number of attempts: 1 Airway Equipment and Method: Stylet and Bite block Placement Confirmation: ETT inserted through vocal cords under direct vision, positive ETCO2 and breath sounds checked- equal and bilateral Secured at: 23 cm Tube secured with: Tape Dental Injury: Teeth and Oropharynx as per pre-operative assessment

## 2021-08-27 NOTE — Op Note (Signed)
OPERATIVE NOTE  Alan [name] Date/Time of Admission: 08/27/2021  1:11 PM  CSN: [id number] Attending Provider: Ebbie Latus A, DO Room/Bed: MCPO/NONE DOB: 1940-08-20 Age: 81 y.o.   Pre-Op Diagnosis: Chronic maxillary sinusitis Nasal mass  Post-Op Diagnosis: Chronic maxillary sinusitis Nasal mass  Procedure: Procedure(s): NASAL ENDOSCOPY WITH REMOVAL OF NASAL MASS  Anesthesia: General  Surgeon(s): [place], DO  Staff: Circulator: Margy Clarks, RN Scrub Person: Zannie Kehr, RN Circulator Assistant: Patrcia Dolly, RN; Celene Squibb, RN  Implants: * No implants in log *  Specimens: ID Type Source Tests Collected by Time Destination  1 : Right nasal cavity mass Tissue PATH Other SURGICAL PATHOLOGY [name] A, DO 08/27/2021 1505   2 : Right nasal sinus contents Tissue PATH Other SURGICAL PATHOLOGY Manoj Enriquez A, DO 8/67/5449 2010     Complications: None  EBL: <5 ML  Condition: stable  Operative Findings:  Papillomatous mass eminating from lateral aspect of middle turbinate, right nasal cavity otherwise normal  Description of Operation: Once operative consent was obtained and the site and surgery were confirmed with the patient and the operating room team, the patient was brought back to the operating room and general endotracheal anesthesia was obtained. The patient was turned over to the ENT service. Lidocaine 1% with 1:100,000 epinephrine was injected into the right middle turbinate and adherent mass lesion. Afrin-soaked pledgets were placed into the nasal cavity, and the patient was prepped and draped in sterile fashion. Attention turned to the right-sided sinonasal cavity.  The mass lesion was noted to be emanating from the lateral aspect of the middle turbinate, and a sickle knife was used to dissect the lesion sharply from the normal-appearing middle turbinate mucosa.  A Tru-Cut forcep was used until the lesion  was freed entirely from the middle turbinate, and the specimen was removed en bloc and sent to pathology as a permanent specimen.  The microdebrider was then used to shave an additional layer tissue from the lateral aspect of the middle turbinate.  Monopolar cautery was used to control bleeding.  Hemostasis was ensured.  The nasal cavity was copiously irrigated, and Arista followed by NasoPore covered in mupirocin ointment was placed in the nasal cavity. An orogastric tube was placed and the stomach cavity was suctioned to reduce postoperative nausea. The patient was turned over to anesthesia service and was extubated in the operating room and transferred to the PACU in stable condition. The patient will be discharged today and followed up in the ENT clinic in 1 week for postoperative check.    Jason Coop, [facility]  08/27/2021

## 2021-08-27 NOTE — H&P (Signed)
Alan [name] is an 81 y.o. male.    Chief Complaint:  Nasal cavity mass  HPI: Patient presents today for planned elective procedure.  He/she denies any interval change in history since office visit on 07/29/2021:  Alan [name] is a 81 y.o. male who presents as a return patient for follow-up of facial pain, pressure and dental pain. Patient was last seen on 06/25/2021, and at that time was prescribed a course of Augmentin due to finding of active purulent drainage in the left nasal cavity. A blood clot was noted on the right. Patient was also advised to begin daily nasal saline rinses, and states that he has noticed an improvement in his nasal congestive symptoms. He also notes improvement overall in the facial pain and discomfort. He continues to experience occasional nasal bleeding, which is right-sided. He is chronically anticoagulated on Eliquis.  Past Medical History:  Diagnosis Date   Allergy    Arthritis    Basal cell carcinoma (BCC)    Cataract    left eye, right cateract removed   DVT (deep venous thrombosis) ([place]) 08/19/2016   GERD (gastroesophageal reflux disease)    Hearing loss 05/08/2015   Hypertension    Iron deficiency anemia    Melanoma ([place])    melanoma on back 2009,    Obesity    Oxygen deficiency    May 2017 d/c on o2 - used less than 2 months - no longer on o2 at home. maw   Pulmonary embolism [facility])    May 2017   Right bundle branch block (RBBB)    SIRS (systemic inflammatory response syndrome) ([place]) 05/08/2015    Past Surgical History:  Procedure Laterality Date   APPENDECTOMY     CATARACT EXTRACTION     COLONOSCOPY     ESOPHAGOGASTRODUODENOSCOPY     TONSILLECTOMY     WISDOM TOOTH EXTRACTION      Family History  Problem Relation Age of Onset   Diabetes Mother    Heart disease Mother    Hypertension Mother    Stroke Mother    Diabetes Father    Heart disease Father    Diabetes Brother    Diabetes Daughter    Colon cancer Neg Hx    Esophageal cancer  Neg Hx    Pancreatic cancer Neg Hx    Prostate cancer Neg Hx    Rectal cancer Neg Hx    Stomach cancer Neg Hx     Social History:  reports that he quit smoking about 47 years ago. His smoking use included cigarettes. He has a 17.00 pack-year smoking history. He has never used smokeless tobacco. He reports current alcohol use. He reports that he does not use drugs.  Allergies:  Allergies  Allergen Reactions   Shellfish Allergy Nausea And Vomiting    Facility-Administered Medications Prior to Admission  Medication Dose Route Frequency Provider Last Rate Last Admin   0.9 %  sodium chloride infusion  500 mL Intravenous Once Alan Mayer, MD       Medications Prior to Admission  Medication Sig Dispense Refill   apixaban (ELIQUIS) 5 MG TABS tablet Take 2 tablets (10 mg total) by mouth 2 (two) times daily. (Patient taking differently: Take 5 mg by mouth 2 (two) times daily.) 28 tablet 0   Ferrous Sulfate (IRON) 325 (65 Fe) MG TABS Take 1 tablet (325 mg total) by mouth daily with breakfast. 30 tablet 0   fluticasone (FLONASE) 50 MCG/ACT nasal spray Place 1 spray into  both nostrils daily.     hydrochlorothiazide (HYDRODIURIL) 25 MG tablet Take 25 mg by mouth daily.     losartan (COZAAR) 100 MG tablet Take 100 mg by mouth daily.  4   omeprazole (PRILOSEC) 20 MG capsule Take 20 mg by mouth daily.     Polyethyl Glyc-Propyl Glyc PF (SYSTANE HYDRATION PF) 0.4-0.3 % SOLN Place 1 drop into both eyes daily as needed (Dry eyes).      No results found for this or any previous visit (from the past 48 hour(s)). No results found.  ROS: ROS  There were no vitals taken for this visit.  PHYSICAL EXAM: Physical Exam Constitutional:      Appearance: Normal appearance.  HENT:     Right Ear: External ear normal.     Left Ear: External ear normal.     Mouth/Throat:     Mouth: Mucous membranes are moist.  Pulmonary:     Effort: Pulmonary effort is normal.  Musculoskeletal:     Cervical back:  Neck supple.  Neurological:     General: No focal deficit present.     Mental Status: He is alert.  Psychiatric:        Mood and Affect: Mood normal.        Behavior: Behavior normal.     Studies Reviewed: CT Sinus reviewed   Assessment/Plan Alan [name] is a 81 y.o. male with waxing and waning symptoms of facial pressure, pain and sensitivity/tingling in his upper teeth since root canal procedure performed in the fall. Patient was noted to have soft tissue abnormality noted to be emanating lateral to the middle turbinate on the right. Upon further investigation, I reviewed a CT neck with contrast which was performed in 2019, and reported an approximate 18m "polyp" in the right nasal cavity. Lesion appears to be relatively stable in size compared to previous imaging.  -To OR today for nasal endoscopy and biopsy with possible right maxillary antrostomy. Risks of surgery, benefits as well as effective postoperative course and recovery were reviewed. All questions answered.    Alan [name] 08/27/2021, 1:15 PM

## 2021-08-27 NOTE — Anesthesia Preprocedure Evaluation (Addendum)
Anesthesia Evaluation  Patient identified by MRN, date of birth, ID band Patient awake    Reviewed: Allergy & Precautions, NPO status , Patient's Chart, lab work & pertinent test results  History of Anesthesia Complications Negative for: history of anesthetic complications  Airway Mallampati: II  TM Distance: >3 FB Neck ROM: Full    Dental  (+) Dental Advisory Given, Teeth Intact   Pulmonary former smoker, PE   Pulmonary exam normal        Cardiovascular hypertension, Pt. on medications + DVT  Normal cardiovascular exam   '17 TTE - mild LVH. There was moderate concentric hypertrophy. EF 55% to 60%. Grade 1 diastolic dysfunction). Mild AI. Trivial MR. RA was mildly dilated.    Neuro/Psych  Hearing loss  negative psych ROS   GI/Hepatic Neg liver ROS, GERD  Medicated and Controlled,  Endo/Other  Morbid obesity  Renal/GU negative Renal ROS     Musculoskeletal  (+) Arthritis ,   Abdominal   Peds  Hematology  On eliquis    Anesthesia Other Findings   Reproductive/Obstetrics                            Anesthesia Physical Anesthesia Plan  ASA: 3  Anesthesia Plan: General   Post-op Pain Management: Tylenol PO (pre-op)*   Induction: Intravenous  PONV Risk Score and Plan: 2 and Treatment may vary due to age or medical condition and Ondansetron  Airway Management Planned: Oral ETT  Additional Equipment: None  Intra-op Plan:   Post-operative Plan: Extubation in OR  Informed Consent: I have reviewed the patients History and Physical, chart, labs and discussed the procedure including the risks, benefits and alternatives for the proposed anesthesia with the patient or authorized representative who has indicated his/her understanding and acceptance.     Dental advisory given  Plan Discussed with: CRNA and Anesthesiologist  Anesthesia Plan Comments:        Anesthesia Quick  Evaluation

## 2021-08-27 NOTE — Progress Notes (Signed)
Pt stepping up to get into a SUV and fell backwards onto bottom.  Pt did not hit his head. Pt denied any pain after fall.  Assisted pt back up and pt stood without difficulty.  Continues to deny pain after standing.  Assessment done on patient outside of vehicle while standing and pt appears to be ok.  Pt and daughter advised that if pt develops any symptoms from fall to call or come to the ED.  Anesthesiologist called and notified.  Plan to call and check on patient tomorrow.

## 2021-08-28 ENCOUNTER — Encounter (HOSPITAL_COMMUNITY): Payer: Self-pay | Admitting: Otolaryngology

## 2021-08-28 NOTE — Anesthesia Postprocedure Evaluation (Signed)
Anesthesia Post Note  Patient: Alan [name]  Procedure(s) Performed: NASAL ENDOSCOPY WITH REMOVAL OF NASAL MASS (Bilateral: Nose)     Patient location during evaluation: PACU Anesthesia Type: General Level of consciousness: awake and alert Pain management: pain level controlled Vital Signs Assessment: post-procedure vital signs reviewed and stable Respiratory status: spontaneous breathing, nonlabored ventilation and respiratory function stable Cardiovascular status: stable and blood pressure returned to baseline Anesthetic complications: no   No notable events documented.  Last Vitals:  Vitals:   08/27/21 1845 08/27/21 1859  BP: 123/70 125/63  Pulse: 75 64  Resp: 13 16  Temp:  36.7 C  SpO2: 97% 96%    Last Pain:  Vitals:   08/27/21 1859  TempSrc:   PainSc: 0-No pain                 Audry Pili

## 2021-08-29 LAB — SURGICAL PATHOLOGY

## 2021-09-24 ENCOUNTER — Ambulatory Visit: Payer: Medicare Other | Admitting: Dermatology
